# Patient Record
Sex: Female | Born: 1950 | Race: White | Hispanic: No | Marital: Married | Smoking: Former smoker
Health system: Southern US, Community
[De-identification: ages and names within clinical notes are randomized; demographics above are authoritative.]

## PROBLEM LIST (undated history)

## (undated) DIAGNOSIS — K219 Gastro-esophageal reflux disease without esophagitis: Secondary | ICD-10-CM

## (undated) DIAGNOSIS — I2510 Atherosclerotic heart disease of native coronary artery without angina pectoris: Secondary | ICD-10-CM

## (undated) DIAGNOSIS — I10 Essential (primary) hypertension: Secondary | ICD-10-CM

## (undated) DIAGNOSIS — Z951 Presence of aortocoronary bypass graft: Secondary | ICD-10-CM

## (undated) DIAGNOSIS — J449 Chronic obstructive pulmonary disease, unspecified: Secondary | ICD-10-CM

## (undated) HISTORY — PX: ABDOMINAL HYSTERECTOMY: SHX81

---

## 2005-08-31 ENCOUNTER — Emergency Department: Payer: Self-pay | Admitting: Internal Medicine

## 2008-08-15 ENCOUNTER — Inpatient Hospital Stay: Payer: Self-pay | Admitting: Internal Medicine

## 2008-09-19 ENCOUNTER — Ambulatory Visit: Payer: Self-pay | Admitting: Gastroenterology

## 2009-10-24 ENCOUNTER — Ambulatory Visit: Payer: Self-pay | Admitting: Unknown Physician Specialty

## 2009-10-26 ENCOUNTER — Ambulatory Visit: Payer: Self-pay | Admitting: Unknown Physician Specialty

## 2009-12-04 ENCOUNTER — Ambulatory Visit: Payer: Self-pay | Admitting: Unknown Physician Specialty

## 2010-08-13 ENCOUNTER — Ambulatory Visit: Payer: Self-pay | Admitting: Orthopedic Surgery

## 2010-10-02 ENCOUNTER — Ambulatory Visit: Payer: Self-pay | Admitting: Orthopedic Surgery

## 2010-10-09 ENCOUNTER — Ambulatory Visit: Payer: Self-pay | Admitting: Orthopedic Surgery

## 2011-02-23 ENCOUNTER — Encounter: Payer: Self-pay | Admitting: *Deleted

## 2011-02-23 ENCOUNTER — Emergency Department (HOSPITAL_COMMUNITY): Payer: 59

## 2011-02-23 ENCOUNTER — Emergency Department (HOSPITAL_COMMUNITY)
Admission: EM | Admit: 2011-02-23 | Discharge: 2011-02-23 | Disposition: A | Discharge status: Home / Self Care | Payer: 59 | Attending: Emergency Medicine | Admitting: Emergency Medicine

## 2011-02-23 DIAGNOSIS — N201 Calculus of ureter: Secondary | ICD-10-CM | POA: Insufficient documentation

## 2011-02-23 DIAGNOSIS — R110 Nausea: Secondary | ICD-10-CM | POA: Insufficient documentation

## 2011-02-23 DIAGNOSIS — I10 Essential (primary) hypertension: Secondary | ICD-10-CM | POA: Insufficient documentation

## 2011-02-23 DIAGNOSIS — E119 Type 2 diabetes mellitus without complications: Secondary | ICD-10-CM | POA: Insufficient documentation

## 2011-02-23 DIAGNOSIS — R109 Unspecified abdominal pain: Secondary | ICD-10-CM | POA: Insufficient documentation

## 2011-02-23 DIAGNOSIS — N200 Calculus of kidney: Secondary | ICD-10-CM

## 2011-02-23 DIAGNOSIS — Z79899 Other long term (current) drug therapy: Secondary | ICD-10-CM | POA: Insufficient documentation

## 2011-02-23 DIAGNOSIS — N1330 Unspecified hydronephrosis: Secondary | ICD-10-CM | POA: Insufficient documentation

## 2011-02-23 HISTORY — DX: Essential (primary) hypertension: I10

## 2011-02-23 LAB — COMPREHENSIVE METABOLIC PANEL
ALT: 17 U/L (ref 0–35)
Alkaline Phosphatase: 79 U/L (ref 39–117)
BUN: 15 mg/dL (ref 6–23)
CO2: 25 mEq/L (ref 19–32)
GFR calc Af Amer: >90 mL/min (ref >90)
GFR calc non Af Amer: >90 mL/min (ref >90)
Glucose, Bld: 212 mg/dL — ABNORMAL HIGH (ref 70–99)
Potassium: 3.8 mEq/L (ref 3.5–5.1)
Sodium: 136 mEq/L (ref 135–145)
Total Bilirubin: 0.6 mg/dL (ref 0.3–1.2)
Total Protein: 6.4 g/dL (ref 6.0–8.3)

## 2011-02-23 LAB — URINE MICROSCOPIC-ADD ON

## 2011-02-23 LAB — CBC
HCT: 33.7 % — ABNORMAL LOW (ref 36.0–46.0)
Hemoglobin: 11 g/dL — ABNORMAL LOW (ref 12.0–15.0)
MCH: 25.6 pg — ABNORMAL LOW (ref 26.0–34.0)
MCHC: 32.6 g/dL (ref 30.0–36.0)
RBC: 4.29 MIL/uL (ref 3.87–5.11)

## 2011-02-23 LAB — URINALYSIS, ROUTINE W REFLEX MICROSCOPIC
Bilirubin Urine: NEGATIVE
Glucose, UA: NEGATIVE mg/dL
Specific Gravity, Urine: 1.023 (ref 1.005–1.030)
Urobilinogen, UA: 1 mg/dL (ref 0.0–1.0)

## 2011-02-23 LAB — DIFFERENTIAL
Basophils Relative: 1 % (ref 0–1)
Eosinophils Absolute: 0.3 10*3/uL (ref 0.0–0.7)
Lymphs Abs: 2.1 10*3/uL (ref 0.7–4.0)
Monocytes Absolute: 0.6 10*3/uL (ref 0.1–1.0)
Monocytes Relative: 8 % (ref 3–12)
Neutro Abs: 4.9 10*3/uL (ref 1.7–7.7)

## 2011-02-23 LAB — LIPASE, BLOOD: Lipase: 60 U/L — ABNORMAL HIGH (ref 11–59)

## 2011-02-23 MED ORDER — ONDANSETRON 4 MG PO TBDP: 8.0000 mg | ORAL_TABLET | Freq: Three times a day (TID) | ORAL | Status: AC | PRN

## 2011-02-23 MED ORDER — TAMSULOSIN HCL 0.4 MG PO CAPS: 0.4000 mg | ORAL_CAPSULE | Freq: Two times a day (BID) | ORAL | Status: DC

## 2011-02-23 MED ORDER — TRAMADOL HCL 50 MG PO TABS: 50.0000 mg | ORAL_TABLET | Freq: Four times a day (QID) | ORAL | Status: AC | PRN

## 2011-02-23 MED ORDER — KETOROLAC TROMETHAMINE 30 MG/ML IJ SOLN
30.0000 mg | Freq: Once | INTRAMUSCULAR | Status: AC
  Administered 2011-02-23: 30 mg via INTRAVENOUS
  Filled 2011-02-23: qty 1

## 2011-02-23 MED ORDER — FENTANYL CITRATE 0.05 MG/ML IJ SOLN
100.0000 ug | Freq: Once | INTRAMUSCULAR | Status: AC
  Administered 2011-02-23: 100 ug via INTRAVENOUS
  Filled 2011-02-23: qty 2

## 2011-02-23 MED ORDER — ONDANSETRON HCL 4 MG/2ML IJ SOLN
4.0000 mg | Freq: Once | INTRAMUSCULAR | Status: AC
  Administered 2011-02-23: 4 mg via INTRAVENOUS
  Filled 2011-02-23: qty 2

## 2011-02-23 NOTE — ED Notes (Signed)
PA at bedside.

## 2011-02-23 NOTE — ED Provider Notes (Signed)
History     CSN: [id number]  Arrival date & time 02/23/11  Debbie [name]   First MD Initiated Contact with Patient 02/23/11 1919      Chief Complaint  Patient presents with  . Flank Pain  HPI Patient presents to the emergency room with complaint of 1.5 hours of nausea and right flank pain. Reports that it started suddenly and has been intermittent in nature. Sharp/colicky. No rectal bleeding or diarrhea. No dysuria. No hematuria per patient. Patient has no history of kidney stones.   Past Medical History  Diagnosis Date  . Diabetes mellitus   . Hypertension     History reviewed. No pertinent past surgical history.  History reviewed. No pertinent family history.  History  Substance Use Topics  . Smoking status: Never Smoker   . Smokeless tobacco: Not on file  . Alcohol Use: No    OB History    Grav Para Term Preterm Abortions TAB SAB Ect Mult Living                  Review of Systems  Constitutional: Negative for fever, chills, diaphoresis and appetite change.  HENT: Negative for neck pain.   Eyes: Negative for photophobia and visual disturbance.  Respiratory: Negative for cough, chest tightness and shortness of breath.   Cardiovascular: Negative for chest pain.  Gastrointestinal: Negative for nausea, vomiting, abdominal pain, diarrhea, constipation and rectal pain.  Genitourinary: Positive for flank pain. Negative for vaginal bleeding, vaginal discharge, difficulty urinating, pelvic pain and dyspareunia.  Musculoskeletal: Negative for back pain.  Skin: Negative for rash.  Neurological: Negative for weakness and numbness.  All other systems reviewed and are negative.    Allergies  Hydrocodone; Percodan; and Statins  Home Medications   Current Outpatient Rx  Name Route Sig Dispense Refill  . ACETAMINOPHEN-CODEINE #3 300-30 MG PO TABS Oral Take 1 tablet by mouth once as needed. For pain     . CINNAMON PO Oral Take 1 tablet by mouth daily.      . OMEGA-3 FATTY ACIDS  1000 MG PO CAPS Oral Take 1 g by mouth daily.      [name] LISINOPRIL PO Oral Take 1 tablet by mouth every morning.      [name] METFORMIN HCL 1000 MG PO TABS Oral Take 1,000 mg by mouth 2 (two) times daily with a meal.      . NAPROXEN SODIUM 550 MG PO TABS Oral Take 550 mg by mouth 2 (two) times daily with a meal.      . OMEPRAZOLE 20 MG PO CPDR Oral Take 20 mg by mouth daily.        BP 185/86  Pulse 80  Temp 97.7 F (36.5 C)  Resp 22  SpO2 100%  Physical Exam  Nursing note and vitals reviewed. Constitutional: She is oriented to person, place, and time. She appears well-developed and well-nourished.  Non-toxic appearance. No distress.       Vital signs stable  HENT:  Head: Normocephalic and atraumatic.  Eyes: EOM are normal. Pupils are equal, round, and reactive to light.  Neck: Normal range of motion. Neck supple.  Cardiovascular: Normal rate, regular rhythm, normal heart sounds and intact distal pulses.  Exam reveals no gallop and no friction rub.   No murmur heard. Pulmonary/Chest: Effort normal and breath sounds normal. No respiratory distress. She has no wheezes.  Abdominal: Soft. Bowel sounds are normal. There is no tenderness. There is no rebound and no guarding.  Musculoskeletal: Normal range of  motion. She exhibits no edema and no tenderness.  Neurological: She is alert and oriented to person, place, and time. She exhibits normal muscle tone.  Skin: Skin is warm and dry. No rash noted. She is not diaphoretic.  Psychiatric: She has a normal mood and affect. Her behavior is normal. Judgment and thought content normal.    ED Course  Procedures (including critical care time)  Patient seen and evaluated.  VSS reviewed. . Nursing notes reviewed. Discussed with attending physician. Initial testing ordered. Will monitor the patient closely. They agree with the treatment plan and diagnosis.   Results for orders placed during the hospital encounter of 02/23/11  CBC      Component Value  Range   WBC 8.0  4.0 - 10.5 (K/uL)   RBC 4.29  3.87 - 5.11 (MIL/uL)   Hemoglobin 11.0 (*) 12.0 - 15.0 (g/dL)   HCT 16.1 (*) 09.6 - 46.0 (%)   MCV 78.6  78.0 - 100.0 (fL)   MCH 25.6 (*) 26.0 - 34.0 (pg)   MCHC 32.6  30.0 - 36.0 (g/dL)   RDW 04.5  40.9 - 81.1 (%)   Platelets 257  150 - 400 (K/uL)  DIFFERENTIAL      Component Value Range   Neutrophils Relative 61  43 - 77 (%)   Neutro Abs 4.9  1.7 - 7.7 (K/uL)   Lymphocytes Relative 27  12 - 46 (%)   Lymphs Abs 2.1  0.7 - 4.0 (K/uL)   Monocytes Relative 8  3 - 12 (%)   Monocytes Absolute 0.6  0.1 - 1.0 (K/uL)   Eosinophils Relative 4  0 - 5 (%)   Eosinophils Absolute 0.3  0.0 - 0.7 (K/uL)   Basophils Relative 1  0 - 1 (%)   Basophils Absolute 0.0  0.0 - 0.1 (K/uL)  URINALYSIS, ROUTINE W REFLEX MICROSCOPIC      Component Value Range   Color, Urine YELLOW  YELLOW    APPearance CLOUDY (*) CLEAR    Specific Gravity, Urine 1.023  1.005 - 1.030    pH 6.0  5.0 - 8.0    Glucose, UA NEGATIVE  NEGATIVE (mg/dL)   Hgb urine dipstick MODERATE (*) NEGATIVE    Bilirubin Urine NEGATIVE  NEGATIVE    Ketones, ur 15 (*) NEGATIVE (mg/dL)   Protein, ur NEGATIVE  NEGATIVE (mg/dL)   Urobilinogen, UA 1.0  0.0 - 1.0 (mg/dL)   Nitrite NEGATIVE  NEGATIVE    Leukocytes, UA MODERATE (*) NEGATIVE   COMPREHENSIVE METABOLIC PANEL      Component Value Range   Sodium 136  135 - 145 (mEq/L)   Potassium 3.8  3.5 - 5.1 (mEq/L)   Chloride 102  96 - 112 (mEq/L)   CO2 25  19 - 32 (mEq/L)   Glucose, Bld 212 (*) 70 - 99 (mg/dL)   BUN 15  6 - 23 (mg/dL)   Creatinine, Ser 9.14  0.50 - 1.10 (mg/dL)   Calcium 9.4  8.4 - 78.2 (mg/dL)   Total Protein 6.4  6.0 - 8.3 (g/dL)   Albumin 3.5  3.5 - 5.2 (g/dL)   AST 17  0 - 37 (U/L)   ALT 17  0 - 35 (U/L)   Alkaline Phosphatase 79  39 - 117 (U/L)   Total Bilirubin 0.6  0.3 - 1.2 (mg/dL)   GFR calc non Af Amer >90  >90 (mL/min)   GFR calc Af Amer >90  >90 (mL/min)  LIPASE, BLOOD  Component Value Range   Lipase 60  (*) 11 - 59 (U/L)  URINE MICROSCOPIC-ADD ON      Component Value Range   Squamous Epithelial / LPF FEW (*) RARE    WBC, UA 11-20  <3 (WBC/hpf)   RBC / HPF 11-20  <3 (RBC/hpf)   Bacteria, UA RARE  RARE    Urine-Other MUCOUS PRESENT     Ct Abdomen Pelvis Wo Contrast  02/23/2011  *RADIOLOGY REPORT*  Clinical Data: Right flank pain.  CT ABDOMEN AND PELVIS WITHOUT CONTRAST  Technique:  Multidetector CT imaging of the abdomen and pelvis was performed following the standard protocol without intravenous contrast.  Comparison: None  Findings: The lung bases are clear.  No pleural effusion. A small hiatal hernia is noted.  The unenhanced appearance of the liver is unremarkable.  No focal lesions or intrahepatic biliary dilatation.  The gallbladder demonstrates numerous small calcified gallstones.  No common bile duct dilatation.  The pancreas is unremarkable.  The spleen is normal in size.  No focal lesions.  The adrenal glands and left kidney are normal.  The right kidney demonstrates marked hydronephrosis along with perinephric interstitial changes and fluid consistent with a high-grade obstruction.  A small upper pole renal calculus is noted.  There is right-sided hydroureter down to an obstructing 2 mm calculus located at the pelvic inlet.  No bladder calculi.  No left-sided ureteral calculi.  The stomach, duodenum, small bowel and colon are grossly normal without oral contrast.  The appendix is normal.  No mesenteric or retroperitoneal masses or lymphadenopathy.  The aorta demonstrates advanced atherosclerotic calcifications but no focal aneurysm.  The uterus is surgically absent.  The ovaries are still present and appear normal.  The bladder is normal.  No pelvic mass, adenopathy or free pelvic fluid collections.  No inguinal mass or hernia.  The bony structures are unremarkable.  IMPRESSION:  1.  2 mm obstructing right ureteral calculus at the pelvic inlet with high grade obstruction. 2.  Small upper pole  right renal calculus. 3.  No other significant acute abdominal/pelvic findings. 4.  Cholelithiasis. 5.  Atherosclerotic calcifications involving the aorta.  Original Report Authenticated By: P. Loralie Champagne, M.D.    Patient seen and re-evaluated. Resting comfortably. VSS stable. NAD. Patient notified of testing results. Stated agreement and understanding. Patient stated understanding to treatment plan and diagnosis.   9:35 PM spoke to dr. Mena Goes who recommended that the patient go home, and follow up with him in the office. Was notified of patients condition and testing results. Denies that any further management is needed at this time.  MDM  Right kidney stone. Stressed importance of following up with urology because of the high grade obstruction. Stated agreement and understanding. Advised of warning signs to return.         Demetrius Charity, [place] 02/23/11 2136

## 2011-02-23 NOTE — ED Notes (Signed)
Patient transported to CT 

## 2011-02-23 NOTE — ED Notes (Signed)
The pt has rt flank and abd pain for 3 hours with nausea.

## 2011-02-23 NOTE — ED Notes (Signed)
Patient currently sitting up in bed; no respiratory or acute distress noted.  Patient updated on plan of care; informed patient that we are currently waiting on PA to come and talk to patient about test results.  Patient has no other questions or concerns at this time; states that he pain medication has helped with her pain.  Family present at bedside.  Will continue to monitor.

## 2011-02-23 NOTE — ED Notes (Signed)
Received bedside report from Jonny Ruiz, [place].  Patient currently sitting up in bed; no respiratory or acute distress noted.  Patient updated on plan of care; informed patient that we need a urine sample.  Patient states that she is unable to go at this time.  Family member at bedside.  Introduced self to patient and updated whiteboard in room.  Will continue to monitor.

## 2011-02-23 NOTE — ED Notes (Signed)
Lab present at bedside.

## 2011-02-23 NOTE — ED Notes (Signed)
Patient back from CT; currently sitting up in bed.  No respiratory or acute distress noted.  Patient updated on plan of care; informed patient that we are currently waiting on CT results.  Patient has no other questions or concerns at this time.  Informed patient again that we need a urine sample.  Will continue to monitor.

## 2011-02-23 NOTE — Discharge Instructions (Signed)
Strain all urine. Follow up with [facility] next week. Return to the ER if you develop the following:   SEEK IMMEDIATE MEDICAL CARE IF:   Pain cannot be controlled with the prescribed medicine.   You have a fever.   The severity or intensity of pain increases over 18 hours and is not relieved by pain medicine.   You develop a new onset of abdominal pain.   You feel faint or pass out.   Kidney Stones Kidney stones (ureteral lithiasis) are deposits that form inside your kidneys. The intense pain is caused by the stone moving through the urinary tract. When the stone moves, the ureter goes into spasm around the stone. The stone is usually passed in the urine.  CAUSES   A disorder that makes certain neck glands produce too much parathyroid hormone (primary hyperparathyroidism).   A buildup of uric acid crystals.   Narrowing (stricture) of the ureter.   A kidney obstruction present at birth (congenital obstruction).   Previous surgery on the kidney or ureters.   Numerous kidney infections.  SYMPTOMS   Feeling sick to your stomach (nauseous).   Throwing up (vomiting).   Blood in the urine (hematuria).   Pain that usually spreads (radiates) to the groin.   Frequency or urgency of urination.  DIAGNOSIS   Taking a history and physical exam.   Blood or urine tests.   Computerized X-ray scan (CT scan).   Occasionally, an examination of the inside of the urinary bladder (cystoscopy) is performed.  TREATMENT   Observation.   Increasing your fluid intake.   Surgery may be needed if you have severe pain or persistent obstruction.  The size, location, and chemical composition are all important variables that will determine the proper choice of action for you. Talk to your caregiver to better understand your situation so that you will minimize the risk of injury to yourself and your kidney.  HOME CARE INSTRUCTIONS   Drink enough water and fluids to keep your urine  clear or pale yellow.   Strain all urine through the provided strainer. Keep all particulate matter and stones for your caregiver to see. The stone causing the pain may be as small as a grain of salt. It is very important to use the strainer each and every time you pass your urine. The collection of your stone will allow your caregiver to analyze it and verify that a stone has actually passed.   Only take over-the-counter or prescription medicines for pain, discomfort, or fever as directed by your caregiver.   Make a follow-up appointment with your caregiver as directed.   Get follow-up X-rays if required. The absence of pain does not always mean that the stone has passed. It may have only stopped moving. If the urine remains completely obstructed, it can cause loss of kidney function or even complete destruction of the kidney. It is your responsibility to make sure X-rays and follow-ups are completed. Ultrasounds of the kidney can show blockages and the status of the kidney. Ultrasounds are not associated with any radiation and can be performed easily in a matter of minutes.  MAKE SURE YOU:   Understand these instructions.   Will watch your condition.   Will get help right away if you are not doing well or get worse.  Document Released: 02/03/2005 Document Revised: 10/16/2010 Document Reviewed: 06/01/2009 [facility] Patient Information 2012 [place], [place].

## 2011-02-23 NOTE — ED Notes (Addendum)
Patient given copy of discharge paperwork; went over discharge instructions with patient.  Instructed patient to follow up with Urology within 2 days, to follow up with primary care physician, to take medications as prescribed (Flomax, Ultram, and Zofran), to strain all urine, and to return to the ED for new, worsening, or concerning symptoms.

## 2011-02-25 LAB — URINE CULTURE

## 2011-03-07 NOTE — ED Provider Notes (Signed)
Medical screening examination/treatment/procedure(s) were performed by non-physician practitioner and as supervising physician I was immediately available for consultation/collaboration.    Nelia Shi, MD 03/07/11 [phone]

## 2013-01-09 ENCOUNTER — Observation Stay: Payer: Self-pay | Admitting: Internal Medicine

## 2013-01-09 LAB — BASIC METABOLIC PANEL
Anion Gap: 5 — ABNORMAL LOW (ref 7–16)
BUN: 16 mg/dL (ref 7–18)
Chloride: 104 mmol/L (ref 98–107)
Co2: 27 mmol/L (ref 21–32)
EGFR (African American): >60
Osmolality: 285 (ref 275–301)
Potassium: 3.8 mmol/L (ref 3.5–5.1)
Sodium: 136 mmol/L (ref 136–145)

## 2013-01-09 LAB — HEMOGLOBIN A1C: Hemoglobin A1C: 13.6 % — ABNORMAL HIGH (ref 4.2–6.3)

## 2013-01-09 LAB — URINALYSIS, COMPLETE
Bacteria: NONE SEEN
Leukocyte Esterase: NEGATIVE
Ph: 7 (ref 4.5–8.0)
Specific Gravity: 1.028 (ref 1.003–1.030)
Squamous Epithelial: 2

## 2013-01-09 LAB — CBC WITH DIFFERENTIAL/PLATELET
Eosinophil #: 0.2 10*3/uL (ref 0.0–0.7)
HCT: 40.5 % (ref 35.0–47.0)
Lymphocyte #: 2.3 10*3/uL (ref 1.0–3.6)
MCHC: 34 g/dL (ref 32.0–36.0)
MCV: 78 fL — ABNORMAL LOW (ref 80–100)
Neutrophil #: 4.5 10*3/uL (ref 1.4–6.5)

## 2013-01-09 LAB — MAGNESIUM: Magnesium: 1.5 mg/dL — ABNORMAL LOW

## 2013-01-09 IMAGING — CR DG CHEST 2V
1 series · 2 of 2 positions shown · non-contrast
Comparison: No priors.

CLINICAL DATA: Dizziness and wheezing.

EXAM:
CHEST  2 VIEW

[Series 1: pa · 0.17mm/px · 2 of 2 slices shown]
[im 1/2]
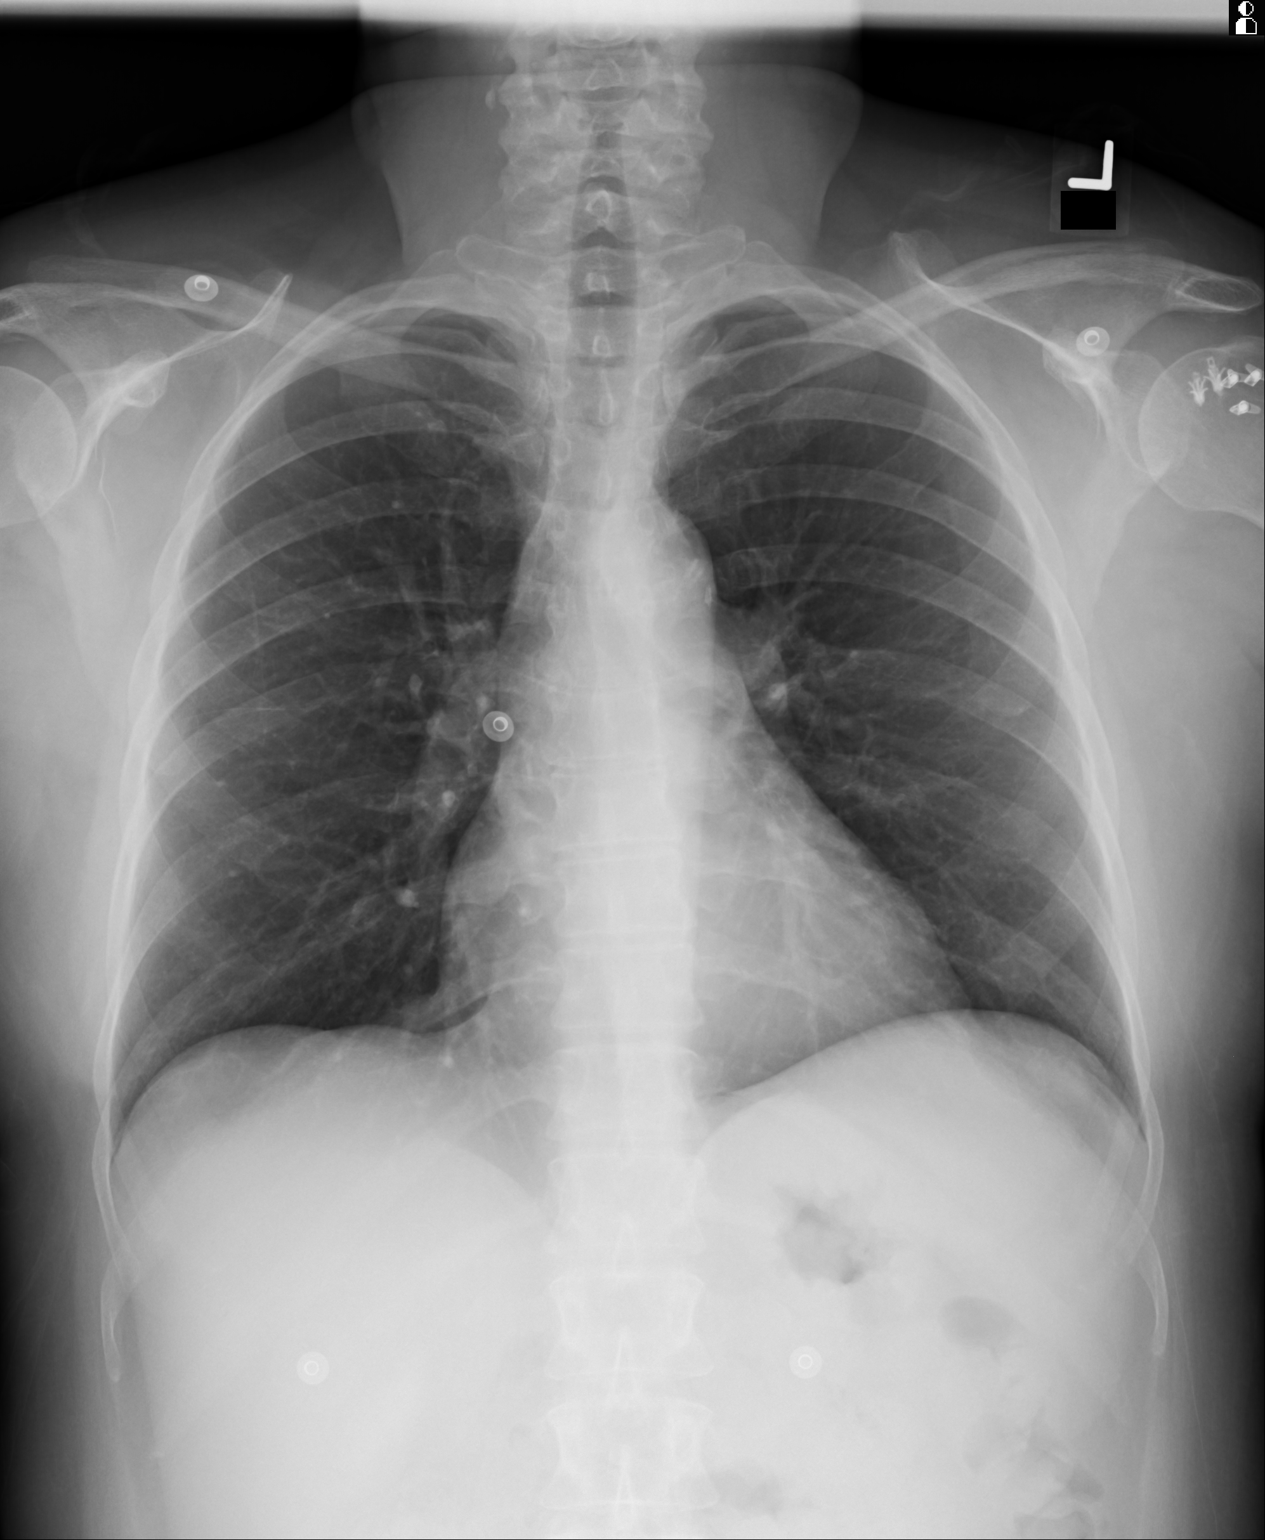
[im 2/2]
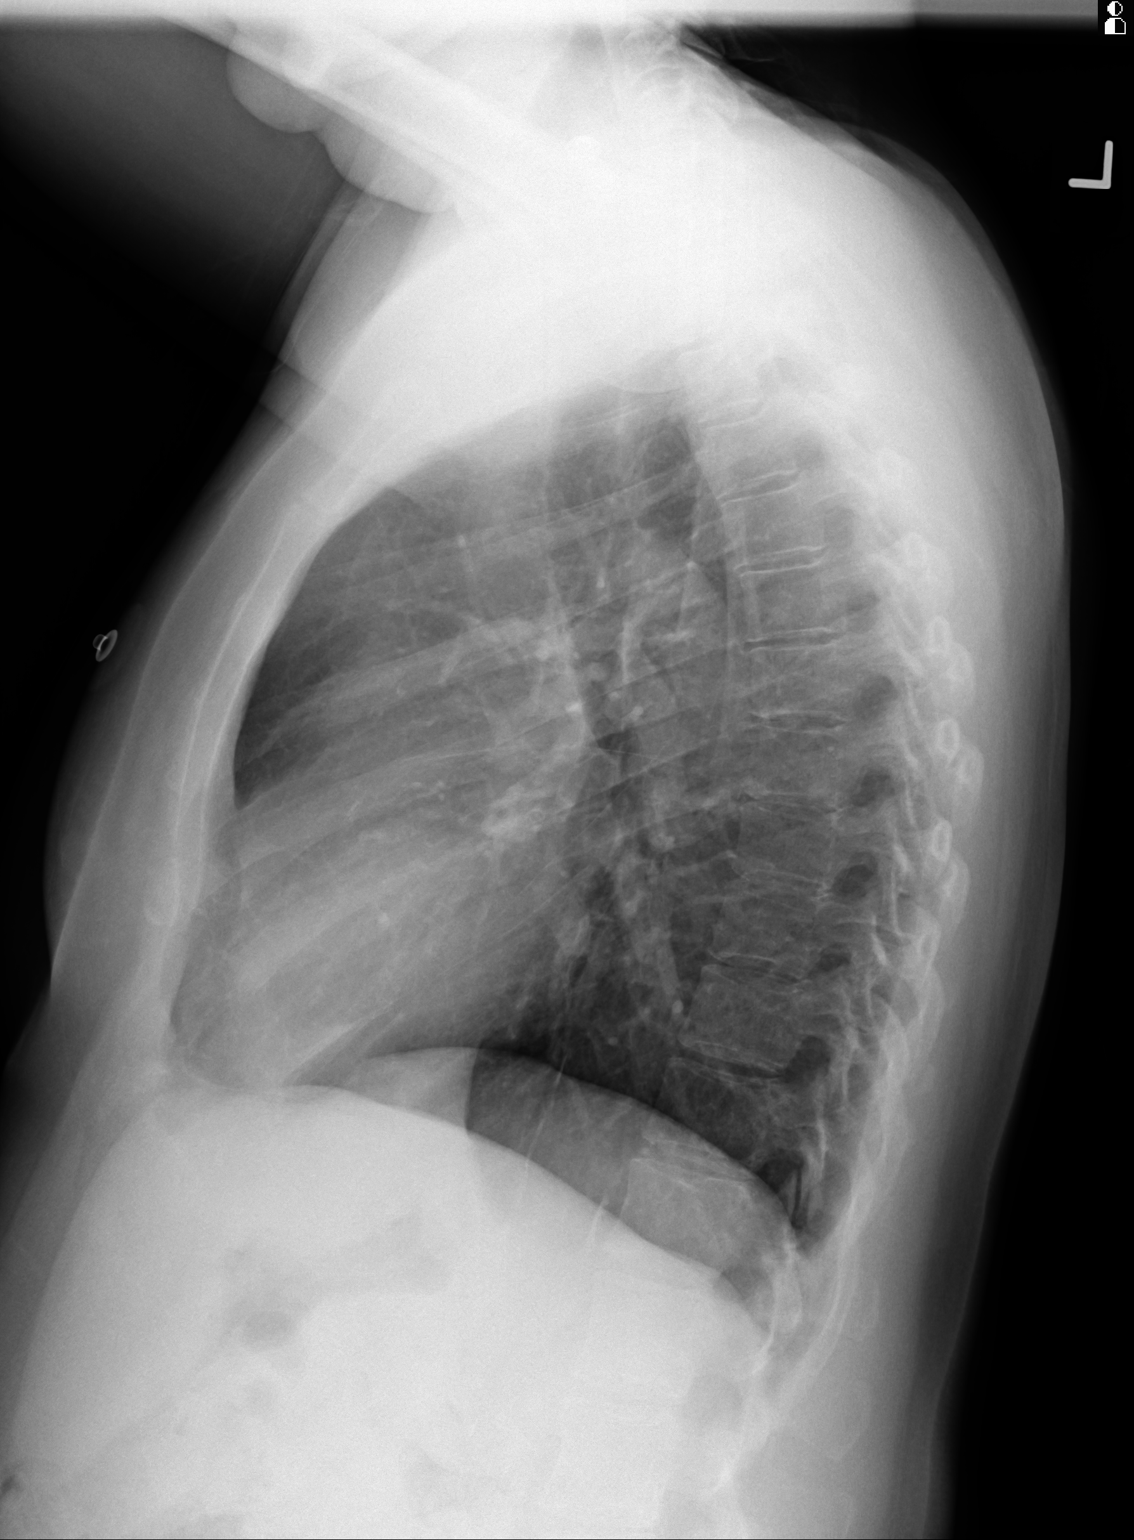

[2 of 2 positions shown; findings below may reference images not displayed]

FINDINGS: Lung volumes are normal. No consolidative airspace disease. No
pleural effusions. No pneumothorax. No pulmonary nodule or mass
noted. Pulmonary vasculature and the cardiomediastinal silhouette
are within normal limits. Atherosclerosis in the thoracic aorta.
IMPRESSION: 1. No radiographic evidence acute cardiopulmonary disease.
2. Atherosclerosis.

## 2013-01-10 LAB — LIPID PANEL
Triglycerides: 166 mg/dL (ref 0–200)
VLDL Cholesterol, Calc: 33 mg/dL (ref 5–40)

## 2013-10-28 DIAGNOSIS — Z951 Presence of aortocoronary bypass graft: Secondary | ICD-10-CM

## 2013-10-28 HISTORY — PX: CORONARY ARTERY BYPASS GRAFT: SHX141

## 2013-10-28 HISTORY — DX: Presence of aortocoronary bypass graft: Z95.1

## 2014-06-09 NOTE — H&P (Signed)
PATIENT NAME:  Debbie [name], Debbie [name] MR#:  [id number] DATE OF BIRTH:  [birth year]  DATE OF ADMISSION:  01/09/2013  PRIMARY CARE PHYSICIAN: Does not have one.   CHIEF COMPLAINT: Dizziness.   HISTORY OF PRESENT ILLNESS: This is a 64 year old female, who presents to the hospital with dizziness, ongoing since yesterday evening around 10:00 p.m. The patient says the dizziness comes and goes. It is associated with some nausea, but no vomiting. She has had no true syncope and no loss of consciousness. The patient has a history of diabetes and hypertension, but has not seen a doctor in 2 years and has not taken any medications in 2 years. She presents to the ER and was noted to have a mildly elevated troponin. She was also noted to be in malignant hypertension. Hospitalist services were contacted for further treatment and evaluation. The patient does complain of chest pain, but she said she has had chest pain on and off for many years. It is more on the left side of her chest, but not radiating to her jaw or the center of her chest. It is not a heaviness. It is a sharp pain. It is not associated with shortness of breath,  nausea, vomiting, or diaphoresis. The patient denies any abdominal pain, any diarrhea, any dysuria, hematuria, or any other associated symptoms presently.   REVIEW OF SYSTEMS: CONSTITUTIONAL: No documented fever. No weight gain or weight loss. EYES: No blurred or double vision. ENT: No tinnitus. No postnasal drip. No redness of the oropharynx. RESPIRATORY: No cough. No wheeze. No hemoptysis. No dyspnea. CARDIOVASCULAR: No chest pain. No orthopnea, no palpitations, no syncope. GASTROINTESTINAL: Positive nausea. No vomiting, no diarrhea. No abdominal pain. No melena or hematochezia. GENITOURINARY: No dysuria, no hematuria. ENDOCRINE: No polyuria or nocturia. No heat or cold intolerance. HEMATOLOGIC: No anemia, no bruising, no bleeding. INTEGUMENTARY: No rashes. No lesions. MUSCULOSKELETAL: No arthritis,  swelling or gout. NEUROLOGIC: No numbness or tingling. No ataxia. Positive dizziness.  PSYCHIATRIC: No anxiety, no insomnia, ADD.   PAST MEDICAL HISTORY: Consistent with diabetes and hypertension.   ALLERGIES: ACTOS, LIPITOR, PERCODAN, PRILOSEC, SPORANOX, STATINS AND ZANTAC. STATINS CAUSE MUSCLE ACHES.   SOCIAL HISTORY: Used to be a smoker, quit about 20 years ago. Does have a 20 pack-year smoking history. Occasional alcohol use. No illicit drug abuse. Lives at home with her husband.   FAMILY HISTORY: Both mother and father are deceased. Father died from complications of pancreatitis. Mother had congestive heart failure, COPD and diabetes.   CURRENT MEDICATIONS: She is currently on no medications.   PHYSICAL EXAMINATION: Presently is as follows:  VITALS SIGNS: Temperature is 97.8, pulse 65, respirations 18, blood pressure 163/83 and sats 98% on room air.  GENERAL: She is a pleasant-appearing female in no apparent distress.  HEENT: Atraumatic, normocephalic. Her extraocular muscles are intact. Pupils equal, reactive to light. Sclerae anicteric. No conjunctival injection. No pharyngeal erythema.  NECK: Supple. There is no jugular venous distention, no bruits, no lymphadenopathy, no thyromegaly.  HEART: Regular rate and rhythm. No murmurs. No rubs. No clicks.  LUNGS: Clear to auscultation bilaterally. No rales or rhonchi. No wheezes.  ABDOMEN: Soft, flat, nontender, nondistended. Has good bowel sounds. No hepatosplenomegaly appreciated.  EXTREMITIES: No evidence of any cyanosis, clubbing, or peripheral edema. Has +2 pedal and radial pulses bilaterally.  NEUROLOGIC: The patient is alert, awake, and oriented x 3 with no focal motor or sensory deficits appreciated bilaterally.  SKIN: Moist and warm with no rashes appreciated.  LYMPHATIC: There is no  cervical or axillary lymphadenopathy.   LABORATORY DATA: Serum glucose of 319, BUN 16, creatinine 0.6, sodium 136, potassium 3.8, chloride 104,  bicarbonate 27. Troponin 0.09. White cell count 7.9, hemoglobin 13.8, hematocrit 40.5, platelet count 210. Urinalysis within normal limits. The patient had a chest x-ray done, which showed no evidence of acute cardiopulmonary disease. The patient also had a CT of the head done, which showed no evidence of any acute intracranial process. The patient also had an EKG, which shows normal sinus rhythm and with normal axis and no evidence of any acute ST or T wave changes.   ASSESSMENT AND PLAN: This is a 64 year old female with past medical history of diabetes and hypertension, who has not seen a physician in 2 years and has not been on any medications for her diabetes or hypertension and presents to the hospital with dizziness and noted to have an elevated troponin.    1.  Dizziness. The exact etiology of this is unclear. Questionable if this is related to underlying malignant hypertension as she is not on any medications versus vertigo or even anginal equivalent given her elevated troponin. For now, I will observe her on telemetry. Follow serial cardiac markers. Her CT head is negative. I do not suspect any other further neurologic source of her dizziness. I will give her p.r.n. meclizine for the vertigo, treat her malignant hypertension and follow her clinically.  2.  Elevated troponin. This is likely in the setting of demand ischemia from her malignant hypertension. I will observe her on telemetry. Continue aspirin, nitroglycerin, add some beta blocker and get a Myoview in the morning. Her EKG does not show any acute ST or T wave changes.  3.  Diabetes. The patient is currently not on any medications. She used to be on metformin and Januvia, but could not tolerate the metformin due to side effects. For now, will place on sliding scale insulin and start her on some oral glipizide. Check a hemoglobin A1c. Follow blood sugars.  4.  Malignant hypertension. Since she has a history of diabetes, I will start her on  some oral lisinopril and also some low-dose beta blocker and also add p.r.n. hydralazine and follow her hemodynamics.   CODE STATUS: The patient is a full code.   TIME SPENT ON ADMISSION: 50 minutes.   ____________________________ Rolly Pancake. Cherlynn Kaiser, MD vjs:aw D: 01/09/2013 09:18:42 ET T: 01/09/2013 09:28:32 ET JOB#: [id number]  cc: Rolly Pancake. Cherlynn Kaiser, MD, <Dictator> Houston Siren MD ELECTRONICALLY SIGNED 01/22/2013 18:33

## 2014-06-09 NOTE — Discharge Summary (Signed)
PATIENT NAME:  Debbie [name], Debbie [name] MR#:  [id number] DATE OF BIRTH:  [birth year]  DATE OF ADMISSION:  01/09/2013 DATE OF DISCHARGE:  01/10/2013  PRESENTING COMPLAINT: Dizziness.   DISCHARGE DIAGNOSES:  1.  Dizziness with transient bradycardia that resolved.  2.  Uncontrolled type 2 diabetes.  3.  Malignant hypertension.  4.  History of migraine headaches.  5.  Medical noncompliance.   Myoview stress test within normal limits. Vital signs stable on discharge, heart rate was 65 to 72, sinus rhythm.   CODE STATUS: Full code.   MEDICATIONS:  1.  Lisinopril 10 mg daily.  2,  Glipizide 10 mg daily.  3.  Aspirin 325 mg p.o. daily.   DIET: Low salt, low fat, low cholesterol, ADA, 1800 calorie diet.   The patient advised to check sugars at home and monitor blood pressure.   FOLLOWUP: With [facility].   CARDIOLOGY CONSULTATION: Dr. Juliann Paresallwood.   Myoview stress test was reported as within normal limits, EF of 61%. Cardiac enzymes x 3 negative. Cholesterol is 287,  HDL is 36 and LDL is 216. Echo Doppler showed EF of 55% to 60%, normal left ventricular systolic function. Troponin was 0.08 and 0.08. UA negative for UTI. CBC within normal limits. Basic metabolic panel within normal limits except glucose of 319. B-type natriuretic peptide is 67. Hemoglobin A1c is 13.6. EKG showed sinus rhythm with fusion complexes.   BRIEF SUMMARY OF HOSPITAL COURSE:  1.  [name] is a 64 year old Caucasian female with uncontrolled hypertension and diabetes, who has medical noncompliance, who comes to the Emergency Room after she had dizziness and noted to have elevated troponin. She was admitted with dizziness. Exact etiology was unclear; however, it could be related to a malignant hypertension and she is not on any meds at home. Her head CT was negative. The patient's symptoms resolved. She did not have any further neurologic source of her dizziness that was identified. 2.  Elevated troponin, likely in the  setting of demand ischemia from malignant hypertension. She was continued on aspirin and nitroglycerin as needed. Myoview was done, which was within normal limits. Beta blockers were discontinued because of her heart rate dropping transiently into the 40s. She was tolerating lisinopril well.  3.  Type 2 diabetes, currently not on medications at home. She used to on metformin and Januvia but could not tolerate it. For now, we started her on glipizide. She is recommended followup dietary recommendations. Her hemoglobin A1c is 13.6.  4.  Malignant hypertension. The patient was placed on lisinopril.  5.  History of migraine headaches. P.r.n. Excedrin as an outpatient. 6.  Hyperlipidemia. The patient cannot tolerate any statins and Zetia is too expensive for her to get hence dietary changes and restrictions were explained to her. Hospital stay otherwise remained stable.   CODE STATUS: The patient remained a full code.   TIME SPENT: 40 minutes. ____________________________ Wylie HailSona A. Allena KatzPatel, MD sap:aw D: 01/12/2013 10:44:07 ET T: 01/12/2013 11:55:56 ET JOB#: [id number]  cc: [name] A. Allena KatzPatel, MD, <Dictator> [facility] Willow OraSONA A Destiney Sanabia MD ELECTRONICALLY SIGNED 01/14/2013 10:41

## 2014-06-10 NOTE — Consult Note (Signed)
PATIENT NAME:  Debbie [name], Latara F MR#:  [id number] DATE OF BIRTH:  [birth year]  DATE OF CONSULTATION:  01/09/2013  REFERRING PHYSICIAN:  Dr. Cherlynn KaiserSainani CONSULTING PHYSICIAN:  Tsutomu Barfoot D. Mikayla Chiusano, MD  HISTORY OF PRESENT ILLNESS: The patient is a 64 year old female with history of hypertension, diabetes, hyperlipidemia, has not been taking any of her medications recently. She states she lost her insurance and stopped taking her medicines, but at this time she started having dizziness on and off since the day before admission. There was no true loss of consciousness, just felt dizzy and near syncopal. Finally came to the Emergency Room and was found elevated troponins and malignant hypertension, with extremely elevated blood pressures. She had had some left-sided chest discomfort, nonradiating, denies shortness of breath. No nausea, vomiting, diarrhea. Felt poorly, so finally was admitted for evaluation and also had some mild bradycardia.  REVIEW OF SYSTEMS: No blackout spells or syncope, just near syncope and vertigo. No weight loss or weight gain, no hemoptysis or hematemesis. No vision change or hearing change. Denies sputum production. Denies cough.   PAST MEDICAL HISTORY: Diabetes, hypertension, hyperlipidemia.  FAMILY HISTORY: Pancreatitis, congestive heart failure, chronic obstructive pulmonary disease, and diabetes.   SOCIAL HISTORY: Former smoker, quit 20 years ago. Married, lives with her family, is working now.   MEDICATIONS: None.   ALLERGIES: ACTOS, LIPITOR, PERCODAN, PRILOSEC, SPIROMAX, STATIN, ZANTAC.   PHYSICAL EXAMINATION: VITAL SIGNS:  Blood pressure 165/80, pulse of 45, respiratory rate 16, afebrile.  HEENT: Normocephalic, atraumatic. Pupils are reactive to light.  NECK: Supple.  LUNGS: Clear.  HEART: Bradycardic. Systolic ejection murmur, left sternal border. PMI nondisplaced.  ABDOMEN: Benign. Nausea, upset stomach, reflux.  SKIN: Normal.   LABORATORY, DIAGNOSTIC, AND  RADIOLOGICAL DATA: Glucose 319, BUN 16, creatinine 0.6, sodium 136, potassium 3.8, chloride 104, bicarbonate 27, troponin 0.09. White count of 7.9, hemoglobin 13.8, hematocrit 40.5, platelet count 310. Urinalysis normal. Chest x-ray negative. CT of the head negative. The patient also had an EKG, which showed normal sinus rhythm slight bradycardia, nonspecific ST-T changes.   ASSESSMENT: Vertigo, elevated troponin, diabetes, malignant hypertension, past history of smoking.   PLAN: 1.  Agree with evaluation including rule out for myocardial infarction. Follow-up cardiac enzymes. Follow up vertigo. Would consider an EEG, consider carotid Dopplers. Consider echocardiogram. Follow-up EKGs, follow-up troponins. Continue hypertension control, hydralazine or amlodipine would be helpful. We will probably stay away from beta blockers, because of her bradycardia. Also consider ACE inhibitors.  2.  For diabetes, would resume diabetes medications and treat as necessary to follow up evidence of hemoglobin A1c as well as fasting sugars.  3.  Recommend evaluation for hypercholesterolemia.  4.  Chest pain of unclear etiology may be angina. Would recommend either a functional study or cardiac catheterization unless symptoms persist or worsen, and base further evaluation on the patient's response.    ____________________________ Bobbie Stackwayne D. Juliann Paresallwood, MD ddc:cg D: 01/09/2013 18:29:08 ET T: 01/10/2013 00:08:34 ET JOB#: [id number]  cc: Naureen Benton D. Juliann Paresallwood, MD, <Dictator> Alwyn PeaWAYNE D Morgyn Marut MD ELECTRONICALLY SIGNED 02/23/2013 10:22

## 2014-09-09 ENCOUNTER — Inpatient Hospital Stay (HOSPITAL_COMMUNITY)
Admission: EM | Admit: 2014-09-09 | Discharge: 2014-09-14 | DRG: 247 | Disposition: A | Discharge status: Home / Self Care | Payer: 59 | Attending: Internal Medicine | Admitting: Internal Medicine

## 2014-09-09 ENCOUNTER — Encounter (HOSPITAL_COMMUNITY): Payer: Self-pay

## 2014-09-09 ENCOUNTER — Emergency Department (HOSPITAL_COMMUNITY): Payer: 59

## 2014-09-09 DIAGNOSIS — I2582 Chronic total occlusion of coronary artery: Secondary | ICD-10-CM | POA: Diagnosis present

## 2014-09-09 DIAGNOSIS — E669 Obesity, unspecified: Secondary | ICD-10-CM | POA: Diagnosis present

## 2014-09-09 DIAGNOSIS — I2510 Atherosclerotic heart disease of native coronary artery without angina pectoris: Secondary | ICD-10-CM | POA: Diagnosis not present

## 2014-09-09 DIAGNOSIS — I200 Unstable angina: Secondary | ICD-10-CM | POA: Diagnosis present

## 2014-09-09 DIAGNOSIS — E1165 Type 2 diabetes mellitus with hyperglycemia: Secondary | ICD-10-CM | POA: Diagnosis present

## 2014-09-09 DIAGNOSIS — R0789 Other chest pain: Secondary | ICD-10-CM | POA: Diagnosis not present

## 2014-09-09 DIAGNOSIS — Z87891 Personal history of nicotine dependence: Secondary | ICD-10-CM

## 2014-09-09 DIAGNOSIS — E119 Type 2 diabetes mellitus without complications: Secondary | ICD-10-CM

## 2014-09-09 DIAGNOSIS — K219 Gastro-esophageal reflux disease without esophagitis: Secondary | ICD-10-CM | POA: Diagnosis present

## 2014-09-09 DIAGNOSIS — Z951 Presence of aortocoronary bypass graft: Secondary | ICD-10-CM

## 2014-09-09 DIAGNOSIS — I25110 Atherosclerotic heart disease of native coronary artery with unstable angina pectoris: Secondary | ICD-10-CM | POA: Diagnosis present

## 2014-09-09 DIAGNOSIS — R079 Chest pain, unspecified: Secondary | ICD-10-CM

## 2014-09-09 DIAGNOSIS — Z885 Allergy status to narcotic agent status: Secondary | ICD-10-CM

## 2014-09-09 DIAGNOSIS — I25710 Atherosclerosis of autologous vein coronary artery bypass graft(s) with unstable angina pectoris: Principal | ICD-10-CM | POA: Diagnosis present

## 2014-09-09 DIAGNOSIS — I10 Essential (primary) hypertension: Secondary | ICD-10-CM | POA: Diagnosis present

## 2014-09-09 DIAGNOSIS — J449 Chronic obstructive pulmonary disease, unspecified: Secondary | ICD-10-CM | POA: Diagnosis present

## 2014-09-09 DIAGNOSIS — Z888 Allergy status to other drugs, medicaments and biological substances status: Secondary | ICD-10-CM

## 2014-09-09 DIAGNOSIS — Z6838 Body mass index (BMI) 38.0-38.9, adult: Secondary | ICD-10-CM

## 2014-09-09 DIAGNOSIS — Z794 Long term (current) use of insulin: Secondary | ICD-10-CM

## 2014-09-09 DIAGNOSIS — Z7951 Long term (current) use of inhaled steroids: Secondary | ICD-10-CM

## 2014-09-09 DIAGNOSIS — Z9861 Coronary angioplasty status: Secondary | ICD-10-CM

## 2014-09-09 DIAGNOSIS — E785 Hyperlipidemia, unspecified: Secondary | ICD-10-CM | POA: Diagnosis present

## 2014-09-09 DIAGNOSIS — R778 Other specified abnormalities of plasma proteins: Secondary | ICD-10-CM | POA: Diagnosis present

## 2014-09-09 DIAGNOSIS — R7989 Other specified abnormal findings of blood chemistry: Secondary | ICD-10-CM | POA: Diagnosis present

## 2014-09-09 DIAGNOSIS — Z791 Long term (current) use of non-steroidal anti-inflammatories (NSAID): Secondary | ICD-10-CM

## 2014-09-09 HISTORY — DX: Gastro-esophageal reflux disease without esophagitis: K21.9

## 2014-09-09 HISTORY — DX: Atherosclerotic heart disease of native coronary artery without angina pectoris: I25.10

## 2014-09-09 HISTORY — DX: Chronic obstructive pulmonary disease, unspecified: J44.9

## 2014-09-09 HISTORY — DX: Presence of aortocoronary bypass graft: Z95.1

## 2014-09-09 LAB — URINE MICROSCOPIC-ADD ON

## 2014-09-09 LAB — BASIC METABOLIC PANEL
Anion gap: 13 (ref 5–15)
BUN: 15 mg/dL (ref 6–20)
CALCIUM: 9.3 mg/dL (ref 8.9–10.3)
CO2: 20 mmol/L — ABNORMAL LOW (ref 22–32)
Chloride: 102 mmol/L (ref 101–111)
Creatinine, Ser: 0.89 mg/dL (ref 0.44–1.00)
GFR calc Af Amer: >60 mL/min (ref >60)
GLUCOSE: 347 mg/dL — AB (ref 65–99)
Potassium: 4 mmol/L (ref 3.5–5.1)
SODIUM: 135 mmol/L (ref 135–145)

## 2014-09-09 LAB — URINALYSIS, ROUTINE W REFLEX MICROSCOPIC
Bilirubin Urine: NEGATIVE
Glucose, UA: >1000 mg/dL — AB
Hgb urine dipstick: NEGATIVE
Ketones, ur: NEGATIVE mg/dL
NITRITE: NEGATIVE
Protein, ur: NEGATIVE mg/dL
Specific Gravity, Urine: 1.018 (ref 1.005–1.030)
UROBILINOGEN UA: 0.2 mg/dL (ref 0.0–1.0)
pH: 6 (ref 5.0–8.0)

## 2014-09-09 LAB — CBC
HCT: 37.4 % (ref 36.0–46.0)
Hemoglobin: 12.4 g/dL (ref 12.0–15.0)
MCH: 26.2 pg (ref 26.0–34.0)
MCHC: 33.2 g/dL (ref 30.0–36.0)
MCV: 78.9 fL (ref 78.0–100.0)
Platelets: 246 10*3/uL (ref 150–400)
RBC: 4.74 MIL/uL (ref 3.87–5.11)
RDW: 14.8 % (ref 11.5–15.5)
WBC: 9 10*3/uL (ref 4.0–10.5)

## 2014-09-09 LAB — BRAIN NATRIURETIC PEPTIDE: B NATRIURETIC PEPTIDE 5: 24.7 pg/mL (ref 0.0–100.0)

## 2014-09-09 LAB — I-STAT TROPONIN, ED: Troponin i, poc: 0.04 ng/mL (ref 0.00–0.08)

## 2014-09-09 LAB — PROTIME-INR
INR: 0.99 (ref 0.00–1.49)
PROTHROMBIN TIME: 13.3 s (ref 11.6–15.2)

## 2014-09-09 MED ORDER — SODIUM CHLORIDE 0.9 % IV BOLUS (SEPSIS)
1000.0000 mL | Freq: Once | INTRAVENOUS | Status: AC
  Administered 2014-09-09: 1000 mL via INTRAVENOUS

## 2014-09-09 MED ORDER — NITROGLYCERIN 2 % TD OINT
1.0000 [in_us] | TOPICAL_OINTMENT | Freq: Once | TRANSDERMAL | Status: AC
  Administered 2014-09-09: 1 [in_us] via TOPICAL
  Filled 2014-09-09: qty 1

## 2014-09-09 NOTE — ED Notes (Signed)
Per [facility], Pt reports CP episode last night that lasted 2 hours and stopped, then pt began to have CP 2 hours ago that subsided after 1 nitro given by EMS. Pt placed on o2 2L [place]. Pt sats at 92% and some wheezing heard by ems and pt refused breathing treatment. SOB worse with exertion. Pt stable and states no chest pain upon arrival to the ED. Pt stable and in NAD. EMS VS 160/94, HR 85, 12 showed tachycardia initially and unremarkable now. 98% on 2L [place]. Pt had quadruple bypass in September 2015 and this feels like the same.

## 2014-09-09 NOTE — ED Provider Notes (Signed)
CSN: [id number]     Arrival date & time 09/09/14  2145 History   First MD Initiated Contact with Patient 09/09/14 2201     Chief Complaint  Patient presents with  . Chest Pain     (Consider location/radiation/quality/duration/timing/severity/associated sxs/prior Treatment) HPI  64 year old female presents with chest pain that lasts about an hour and a half tonight. The patient had an episode similar to this yesterday that seemed to resolve on its own. Also had one episode last week. Patient noticed pain in her left chest that radiated to her left arm and cause arm numbness while she was sitting on the couch. She got up and went outside and washes walking her symptoms seem to worsen. She had associated shortness of breath as well. Patient states this feels similar to when she required a CABG in September 2015 in [place]. Patient's pain went away after she took a nitroglycerin. She was given aspirin by EMS. She has noticed over last 1 week or so that she's been having bilateral lower extremity edema. Was placed on a fluid pill but states she thinks her legs only improved mildly. Has gained 10 pounds in the past 2 weeks. Patient denies sweating but states it was associated nausea and lightheadedness. Currently feels comfortable.  Past Medical History  Diagnosis Date  . Diabetes mellitus   . Hypertension    History reviewed. No pertinent past surgical history. History reviewed. No pertinent family history. History  Substance Use Topics  . Smoking status: Never Smoker   . Smokeless tobacco: Not on file  . Alcohol Use: No   OB History    No data available     Review of Systems  Constitutional: Negative for diaphoresis.  Respiratory: Positive for shortness of breath.   Cardiovascular: Positive for chest pain and leg swelling.  Gastrointestinal: Positive for nausea. Negative for vomiting and abdominal pain.  Neurological: Positive for light-headedness.  All other systems reviewed and  are negative.     Allergies  Hydrocodone; Percodan; and Statins  Home Medications   Prior to Admission medications   Medication Sig Start Date End Date Taking? Authorizing Provider  acetaminophen-codeine (TYLENOL #3) 300-30 MG per tablet Take 1 tablet by mouth once as needed. For pain     Historical Provider, MD  CINNAMON PO Take 1 tablet by mouth daily.      Historical Provider, MD  fish oil-omega-3 fatty acids 1000 MG capsule Take 1 g by mouth daily.      Historical Provider, MD  LISINOPRIL PO Take 1 tablet by mouth every morning.      Historical Provider, MD  metFORMIN (GLUCOPHAGE) 1000 MG tablet Take 1,000 mg by mouth 2 (two) times daily with a meal.      Historical Provider, MD  naproxen sodium (ANAPROX) 550 MG tablet Take 550 mg by mouth 2 (two) times daily with a meal.      Historical Provider, MD  omeprazole (PRILOSEC) 20 MG capsule Take 20 mg by mouth daily.      Historical Provider, MD  Tamsulosin HCl (FLOMAX) 0.4 MG CAPS Take 1 capsule (0.4 mg total) by mouth 2 (two) times daily. 02/23/11   Rochel Brome, PA-C   There were no vitals taken for this visit. Physical Exam  Constitutional: She is oriented to person, place, and time. She appears well-developed and well-nourished.  HENT:  Head: Normocephalic and atraumatic.  Right Ear: External ear normal.  Left Ear: External ear normal.  Nose: Nose normal.  Eyes: Right  eye exhibits no discharge. Left eye exhibits no discharge.  Cardiovascular: Normal rate, regular rhythm and normal heart sounds.   Pulmonary/Chest: Effort normal and breath sounds normal. She has no wheezes. She has no rales.  Abdominal: Soft. There is no tenderness.  Musculoskeletal: She exhibits edema (mild bilateral lower extremity edema, symmetric).  Neurological: She is alert and oriented to person, place, and time.  Skin: Skin is warm and dry.  Nursing note and vitals reviewed.   ED Course  Procedures (including critical care time) Labs Review Labs  Reviewed  BASIC METABOLIC PANEL - Abnormal; Notable for the following:    CO2 20 (*)    Glucose, Bld 347 (*)    All other components within normal limits  CBC  PROTIME-INR  BRAIN NATRIURETIC PEPTIDE  URINALYSIS, ROUTINE W REFLEX MICROSCOPIC (NOT AT [facility])  Rosezena Sensor, ED    Imaging Review Dg Chest 2 View  09/09/2014   CLINICAL DATA:  Acute onset of left-sided chest pain and shortness of breath. Initial encounter.  EXAM: CHEST  2 VIEW  COMPARISON:  Chest radiograph performed 01/09/2013  FINDINGS: The lungs are well-aerated. Mild bibasilar airspace opacities may reflect atelectasis or mild pneumonia. There is no evidence of pleural effusion or pneumothorax.  The heart is borderline normal in size. The patient is status post median sternotomy. No acute osseous abnormalities are seen. The patient is status post left-sided rotator cuff repair.  IMPRESSION: Mild bibasilar airspace opacities may reflect atelectasis or mild pneumonia.   Electronically Signed   By: Roanna Raider M.D.   On: 09/09/2014 23:04     EKG Interpretation   Date/Time:  Saturday September 09 2014 22:04:34 EDT Ventricular Rate:  82 PR Interval:  167 QRS Duration: 87 QT Interval:  377 QTC Calculation: 440 R Axis:   52 Text Interpretation:  Normal sinus rhythm Anterior infarct, age  indeterminate T wave changes V2-V3 new compared to 2014 Confirmed by  Deaira Leckey  MD, Dawsyn Ramsaran ([id number]) on 09/09/2014 10:13:06 PM      MDM   Final diagnoses:  Acute chest pain    Patient is currently asymptomatic. CP is concerning for cardiac etiology. Given no acute EKG changes, normal troponin and no pain now, do not feel heparin is needed acutely. Will need ACS rule out. Highly doubt PE or dissection. D/w Dr. Mayford Knife, who recommends medicine admission with cards consult if needed. Stable for telemetry admission    Pricilla Loveless, MD 09/09/14 2355

## 2014-09-09 NOTE — ED Notes (Signed)
MD at bedside. 

## 2014-09-09 NOTE — H&P (Signed)
[facility] History and Physical  Debbie FUKUSHIMA ZOX:[id number] DOB: [birth year] DOA: 09/09/2014  Referring physician: ED physician PCP: Pcp Not In System   Chief Complaint: Chest pain  HPI:  Debbie [name] is a 65yo woman with PMH of DM2, HLD, CAD s/p CABG, COPD, HTN, GERD who presents for chest pain.  She reports that last week she developed chest pain when trying to have sex with her husband.  She rested and the symptoms went away.  This happened again last evening and improved with rest.  Today, she developed chest pain with walking to the bedroom.  She took a nitro but the fact that it was so bad scared her and she called EMS.  Debbie [name] has been SOB since her CABG surgery in September of last year, but the shortness of breath has worsened in the last week.  During this last episode, she had radiation of the pain to her left arm/elbow.  She describes the pain as pulling or muscle tightening.  She has further associated symptoms of nausea, LE swelling and weight gain of 10 pounds.  She further has occasional wheezing and heartburn.  She is a former smoker and quit 24 years ago.  She further reports an episode of severe cold intolerance during one of these episodes.  Given her fatigue and this episode, + swelling, will check TSH.   Debbie [name] reports these symptoms are very similar to when she had her CABG last year.  She presented to a hospital in [facility] and had her surgery there.  Records are not available currently.  Debbie [name] is now chest pain free.  She has apparent new TWI and and q waves in anterior leads.  Her initial TnI is 0.04.  She has nitropaste in place.    Assessment and Plan:  Chest pain on exertion - Symptoms and history are concerning for unstable angina - Repeat EKG on arrival to floor for change - AM EKG - Cycle TnI, first is 0.04 - Telemetry - Cardiology should likely be consulted in AM - Attempt to get records from outside hospital - Nitro prn for pain -  Continue ranexa - Patient unable to tolerate beta blocker or statin, these were not started - She is on praluent - Check lipid panel - Check TSH as above  Called by nursing prior to patient being transferred to floor, chest pain returned, nitro given with minimal relief, EKG unchanged.  Plan to cycle enzymes ongoing.  Status changed to cardiac floor for further monitoring.   CAD s/p CABG 10/2013 - Patient with what sounds like stable angina, progressing to unstable angina.  CE and EKG unchanged so far - Attempt to get records from CABG - TTE given swelling, possible post MI CHF - CXR with atelectasis, no pulmonary edema noted  DM2 (diabetes mellitus, type 2) - Last A1C in our system 13.6 from 2014 - Check A1C - Continue cinnamon, lantus.  Started SSI - Patient reports inability to tolerate metformin   COPD (chronic obstructive pulmonary disease) - Mild per patient - Duonebs prn    Benign essential HTN - Continue home medications with norvasc, lisinopril - Patient could not tolerate BB due to side effect of fatigue, but given recurrence of chest pain, it may be important to start this medication    GERD (gastroesophageal reflux disease) - Protonix while in house.    Radiological Exams on Admission: Dg Chest 2 View  09/09/2014   CLINICAL DATA:  Acute onset of left-sided  chest pain and shortness of breath. Initial encounter.  EXAM: CHEST  2 VIEW  COMPARISON:  Chest radiograph performed 01/09/2013  FINDINGS: The lungs are well-aerated. Mild bibasilar airspace opacities may reflect atelectasis or mild pneumonia. There is no evidence of pleural effusion or pneumothorax.  The heart is borderline normal in size. The patient is status post median sternotomy. No acute osseous abnormalities are seen. The patient is status post left-sided rotator cuff repair.  IMPRESSION: Mild bibasilar airspace opacities may reflect atelectasis or mild pneumonia.   Electronically Signed   By: Roanna Raider  M.D.   On: 09/09/2014 23:04   Code Status: Full Family Communication: Pt at bedside Disposition Plan: Admit for further evaluation    Debe Coder, MD [phone]   Review of Systems:  Constitutional: + for malaise, fatigue.  Taken off beta blocker to see if this improves. Negative for fever, chills and diaphoresis.  HENT: Negative for hearing loss, ear pain Eyes: Negative for blurred vision, double vision Respiratory: + for persistent and worsening SOB, wheezing Negative for cough, hemoptysis, sputum production  Cardiovascular: + for chest pain, leg swelling.  Negative for palpitations, orthopnea, claudication.  Gastrointestinal: + for nausea, heartburn Negative for vomiting and abdominal pain,  constipation, blood in stool and melena.  Genitourinary: Negative for dysuria, urgency Musculoskeletal: Negative for myalgias, back pain, joint pain and falls.  Skin: Negative for itching and rash.  Neurological: Negative for dizziness and weakness Psychiatric/Behavioral: The patient is not nervous/anxious.      Past Medical History  Diagnosis Date  . Diabetes mellitus   . Hypertension   . CAD (coronary artery disease)   . S/P CABG (coronary artery bypass graft)   . COPD (chronic obstructive pulmonary disease)   . GERD (gastroesophageal reflux disease)     Past Surgical History  Procedure Laterality Date  . Coronary artery bypass graft      2015    Social History:  reports that she has quit smoking. She does not have any smokeless tobacco history on file. She reports that she does not drink alcohol or use illicit drugs.  Allergies  Allergen Reactions  . Hydrocodone Other (See Comments)    Hallucinations  . Percodan [Oxycodone-Aspirin] Other (See Comments)    hallucination  . Statins Other (See Comments)    Muscles ache    History reviewed. No pertinent family history.  Medications: Reviewed form patient had  Ranexa 500 every 12 hours Norvasc 5 mg daily Lisinopril 20 mg  daily Lantus 25 units at bedtime Praluent /mL every 14 days Cinnamon  daily Aspirin 325 daily Lansoprazole 15 mg daily Albuterol  2 puffs q 6 hours prn wheezing Vitamin D 2000 units daily MVI  Physical Exam: Filed Vitals:   09/09/14 2222 09/09/14 2230 09/09/14 2318 09/09/14 2330  BP: 157/67 150/56 140/56 145/66  Pulse: 88 85 75 72  Temp: 97.9 F (36.6 C)     TempSrc: Oral     Resp: SpO2: 99% 95% 98% 97%    Physical Exam  Constitutional: Appears well-developed and well-nourished. No distress. Tan HENT: Normocephalic. Atraumatic Eyes: Conjunctivae and EOM are normal. no scleral icterus.  Neck: Normal ROM. Neck supple. No JVD.  CVS: RR, NR, S1/S2 +, no murmurs.  Healing midline scare on the sternum.  No reproduction of pain.  Pulmonary: Effort and breath sounds normal, no rhonchi, wheezes, rales.  Abdominal: Soft. BS +,  no distension, tenderness Musculoskeletal: + non pitting edema to bilateral LE.  Pulses  intact.  Neuro: Alert. Oriented X 3 Skin: Skin is warm and dry. No rash noted. Not diaphoretic. No erythema. No pallor.  Psychiatric: Normal mood and affect. Behavior, judgment, thought content normal.   Labs on Admission:  Basic Metabolic Panel:  Recent Labs Lab 09/09/14 2214  NA 135  K 4.0  CL 102  CO2 20*  GLUCOSE 347*  BUN 15  CREATININE 0.89  CALCIUM 9.3   CBC:  Recent Labs Lab 09/09/14 2214  WBC 9.0  HGB 12.4  HCT 37.4  MCV 78.9  PLT 246   TnI 0.04  EKG: Normal sinus rhythm, possible anterior infarct, old with q waves in V1-V2, TWI in V2-V3.  Changed from previous, but we do not have EKGs from the time around her surgery.    If 7PM-7AM, please contact night-coverage [email or web address] Password [email or web address] 09/10/2014, 12:01 AM

## 2014-09-09 NOTE — ED Notes (Signed)
Patient transported to XR. 

## 2014-09-09 NOTE — ED Notes (Signed)
EKG was delayed, due to another pt being in the room at the time of arrival. Pt was placed on an electric bed, which was laying flat at the time and pt was unable to lay flat. Once the bed was moved to the room, the plug was stuck and had to be maneuvered before it could be plugged into the wall. The pt also asked to use the bathroom, once the bed was adjusted.

## 2014-09-10 ENCOUNTER — Encounter (HOSPITAL_COMMUNITY): Payer: Self-pay | Admitting: Internal Medicine

## 2014-09-10 DIAGNOSIS — E1165 Type 2 diabetes mellitus with hyperglycemia: Secondary | ICD-10-CM | POA: Diagnosis not present

## 2014-09-10 DIAGNOSIS — I200 Unstable angina: Secondary | ICD-10-CM | POA: Diagnosis not present

## 2014-09-10 DIAGNOSIS — R079 Chest pain, unspecified: Secondary | ICD-10-CM

## 2014-09-10 DIAGNOSIS — E1159 Type 2 diabetes mellitus with other circulatory complications: Secondary | ICD-10-CM | POA: Diagnosis not present

## 2014-09-10 DIAGNOSIS — I10 Essential (primary) hypertension: Secondary | ICD-10-CM | POA: Diagnosis not present

## 2014-09-10 DIAGNOSIS — R7989 Other specified abnormal findings of blood chemistry: Secondary | ICD-10-CM | POA: Diagnosis not present

## 2014-09-10 DIAGNOSIS — E785 Hyperlipidemia, unspecified: Secondary | ICD-10-CM | POA: Diagnosis not present

## 2014-09-10 DIAGNOSIS — Z951 Presence of aortocoronary bypass graft: Secondary | ICD-10-CM | POA: Diagnosis not present

## 2014-09-10 DIAGNOSIS — I2510 Atherosclerotic heart disease of native coronary artery without angina pectoris: Secondary | ICD-10-CM | POA: Diagnosis not present

## 2014-09-10 DIAGNOSIS — E119 Type 2 diabetes mellitus without complications: Secondary | ICD-10-CM | POA: Diagnosis not present

## 2014-09-10 LAB — GLUCOSE, CAPILLARY
GLUCOSE-CAPILLARY: 241 mg/dL — AB (ref 65–99)
GLUCOSE-CAPILLARY: 158 mg/dL — AB (ref 65–99)
GLUCOSE-CAPILLARY: 135 mg/dL — AB (ref 65–99)
Glucose-Capillary: 260 mg/dL — ABNORMAL HIGH (ref 65–99)
Glucose-Capillary: 246 mg/dL — ABNORMAL HIGH (ref 65–99)

## 2014-09-10 LAB — CBC
HEMATOCRIT: 33.5 % — AB (ref 36.0–46.0)
HEMOGLOBIN: 11.1 g/dL — AB (ref 12.0–15.0)
MCH: 26.3 pg (ref 26.0–34.0)
MCHC: 33.1 g/dL (ref 30.0–36.0)
MCV: 79.4 fL (ref 78.0–100.0)
Platelets: 210 10*3/uL (ref 150–400)
RBC: 4.22 MIL/uL (ref 3.87–5.11)
RDW: 15 % (ref 11.5–15.5)
WBC: 9.3 10*3/uL (ref 4.0–10.5)

## 2014-09-10 LAB — LIPID PANEL
Cholesterol: 216 mg/dL — ABNORMAL HIGH (ref 0–200)
HDL: 45 mg/dL (ref >40)
LDL Cholesterol: 146 mg/dL — ABNORMAL HIGH (ref 0–99)
TRIGLYCERIDES: 124 mg/dL (ref <150)
Total CHOL/HDL Ratio: 4.8 RATIO
VLDL: 25 mg/dL (ref 0–40)

## 2014-09-10 LAB — TROPONIN I
TROPONIN I: 0.17 ng/mL — AB (ref <0.031)
Troponin I: 0.18 ng/mL — ABNORMAL HIGH (ref <0.031)

## 2014-09-10 LAB — CREATININE, SERUM
Creatinine, Ser: 0.78 mg/dL (ref 0.44–1.00)
GFR calc Af Amer: >60 mL/min (ref >60)

## 2014-09-10 LAB — TSH: TSH: 1.55 u[IU]/mL (ref 0.350–4.500)

## 2014-09-10 MED ORDER — ENOXAPARIN SODIUM 40 MG/0.4ML ~~LOC~~ SOLN
40.0000 mg | SUBCUTANEOUS | Status: DC
  Administered 2014-09-10 – 2014-09-11 (×2): 40 mg via SUBCUTANEOUS
  Filled 2014-09-10 (×2): qty 0.4

## 2014-09-10 MED ORDER — VITAMIN D 1000 UNITS PO TABS
2000.0000 [IU] | ORAL_TABLET | Freq: Every day | ORAL | Status: DC
  Administered 2014-09-10 – 2014-09-13 (×4): 2000 [IU] via ORAL
  Filled 2014-09-10 (×5): qty 2

## 2014-09-10 MED ORDER — AMLODIPINE BESYLATE 5 MG PO TABS
5.0000 mg | ORAL_TABLET | Freq: Every day | ORAL | Status: DC
  Administered 2014-09-10: 5 mg via ORAL
  Filled 2014-09-10 (×2): qty 1

## 2014-09-10 MED ORDER — NITROGLYCERIN 0.4 MG SL SUBL
SUBLINGUAL_TABLET | SUBLINGUAL | Status: AC
  Administered 2014-09-10: 0.4 mg
  Filled 2014-09-10: qty 1

## 2014-09-10 MED ORDER — ASPIRIN EC 325 MG PO TBEC
325.0000 mg | DELAYED_RELEASE_TABLET | Freq: Every day | ORAL | Status: DC
  Administered 2014-09-10 – 2014-09-13 (×4): 325 mg via ORAL
  Filled 2014-09-10 (×4): qty 1

## 2014-09-10 MED ORDER — ALIROCUMAB 75 MG/ML ~~LOC~~ SOPN: 1.0000 | PEN_INJECTOR | SUBCUTANEOUS | Status: DC

## 2014-09-10 MED ORDER — ADULT MULTIVITAMIN W/MINERALS CH
2.0000 | ORAL_TABLET | Freq: Every day | ORAL | Status: DC
  Administered 2014-09-10 – 2014-09-13 (×4): 2 via ORAL
  Filled 2014-09-10 (×5): qty 2

## 2014-09-10 MED ORDER — ACETAMINOPHEN 325 MG PO TABS
650.0000 mg | ORAL_TABLET | ORAL | Status: DC | PRN
  Administered 2014-09-12: 650 mg via ORAL
  Filled 2014-09-10: qty 2

## 2014-09-10 MED ORDER — RANOLAZINE ER 500 MG PO TB12
500.0000 mg | ORAL_TABLET | Freq: Two times a day (BID) | ORAL | Status: DC
  Administered 2014-09-10 – 2014-09-13 (×8): 500 mg via ORAL
  Filled 2014-09-10 (×9): qty 1

## 2014-09-10 MED ORDER — INSULIN ASPART 100 UNIT/ML ~~LOC~~ SOLN
0.0000 [IU] | Freq: Every day | SUBCUTANEOUS | Status: DC
  Administered 2014-09-10 (×2): 2 [IU] via SUBCUTANEOUS

## 2014-09-10 MED ORDER — CINNAMON 500 MG PO CAPS: 2000.0000 mg | ORAL_CAPSULE | Freq: Every day | ORAL | Status: DC

## 2014-09-10 MED ORDER — ONDANSETRON HCL 4 MG/2ML IJ SOLN
4.0000 mg | Freq: Four times a day (QID) | INTRAMUSCULAR | Status: DC | PRN
  Administered 2014-09-12: 4 mg via INTRAVENOUS
  Filled 2014-09-10: qty 2

## 2014-09-10 MED ORDER — PANTOPRAZOLE SODIUM 20 MG PO TBEC
20.0000 mg | DELAYED_RELEASE_TABLET | Freq: Every day | ORAL | Status: DC
  Administered 2014-09-10 – 2014-09-14 (×5): 20 mg via ORAL
  Filled 2014-09-10 (×5): qty 1

## 2014-09-10 MED ORDER — ALBUTEROL SULFATE HFA 108 (90 BASE) MCG/ACT IN AERS: 2.0000 | INHALATION_SPRAY | Freq: Four times a day (QID) | RESPIRATORY_TRACT | Status: DC | PRN

## 2014-09-10 MED ORDER — LISINOPRIL 10 MG PO TABS
20.0000 mg | ORAL_TABLET | Freq: Every day | ORAL | Status: DC
  Administered 2014-09-10 – 2014-09-13 (×4): 20 mg via ORAL
  Filled 2014-09-10 (×4): qty 1

## 2014-09-10 MED ORDER — INSULIN GLARGINE 100 UNIT/ML ~~LOC~~ SOLN
15.0000 [IU] | Freq: Every day | SUBCUTANEOUS | Status: DC
  Administered 2014-09-10 – 2014-09-11 (×3): 15 [IU] via SUBCUTANEOUS
  Filled 2014-09-10 (×4): qty 0.15

## 2014-09-10 MED ORDER — INSULIN ASPART 100 UNIT/ML ~~LOC~~ SOLN
0.0000 [IU] | Freq: Three times a day (TID) | SUBCUTANEOUS | Status: DC
  Administered 2014-09-10: 8 [IU] via SUBCUTANEOUS
  Administered 2014-09-10 – 2014-09-11 (×2): 3 [IU] via SUBCUTANEOUS
  Administered 2014-09-11 – 2014-09-12 (×4): 5 [IU] via SUBCUTANEOUS
  Administered 2014-09-13 – 2014-09-14 (×3): 3 [IU] via SUBCUTANEOUS

## 2014-09-10 MED ORDER — MORPHINE SULFATE 2 MG/ML IJ SOLN
2.0000 mg | INTRAMUSCULAR | Status: DC | PRN
  Administered 2014-09-13: 2 mg via INTRAVENOUS
  Filled 2014-09-10: qty 1

## 2014-09-10 MED ORDER — IPRATROPIUM-ALBUTEROL 0.5-2.5 (3) MG/3ML IN SOLN: 3.0000 mL | Freq: Four times a day (QID) | RESPIRATORY_TRACT | Status: DC | PRN

## 2014-09-10 MED ORDER — GI COCKTAIL ~~LOC~~
30.0000 mL | Freq: Four times a day (QID) | ORAL | Status: DC | PRN
  Filled 2014-09-10: qty 30

## 2014-09-10 NOTE — ED Notes (Signed)
Patient complains of chest pain and shortness of breath, placed on 2L via nasal cannula, conducted ekg and provided to Dr. Criss Alvine. Patient is currently wearing nitro paste, received aspirin with ems. MD acknowledges, gives verbal order for nitro tablets subling, and follow up with admitting MD.

## 2014-09-10 NOTE — ED Notes (Signed)
Dr. MulleCriselda Peachesalls back, provided update on patient status, 1 nitro tab given so far, chest pain no different, blood pressure changed from 179/64 to 151/62. EKG completed and given to dr. Criss Alvine, also available in computer. Bed changed from 2W to 3W. MD acknwledges, no new orders.

## 2014-09-10 NOTE — ED Notes (Addendum)
Spoke with receiving RN who wants Blood Glucose level in CMP 347 to be addressed; Will have Md paged to place orders; Pt has admission orders that addresses BS

## 2014-09-10 NOTE — ED Notes (Signed)
Notified bed placement patient having active chest pain, currently assigned to 2W, they will not accept active chest pain patients. Mary acknowledges, new bed assigment pending. Admitting MD paged to update.

## 2014-09-10 NOTE — ED Notes (Signed)
Attempt to call report to floor RN will call back

## 2014-09-10 NOTE — Consult Note (Signed)
Referring Physician:  Primary Physician: Primary Cardiologist: Reason for Consultation:    HPI:  Debbie [name] is a 64yo woman with PMH of DM2, HLD, CAD s/p CABG 9/ 2015, COPD, HTN, GERD whom we are asked to see for CP.   She has chronic chest discomfort almost all her life. Says it happens almost every day when she gets up to do something. Got a little better after CABG but not much. She has been on Ranexa for a long time. Her primary cardiologist started her on metoprolol but she stopped this 5 months ago due to severe fatigue.    Last week developed chest pain while having sex with her husband. Rated it 3/10. Two days ago while walking to the bedroom had 6-7/10 CP accompanied with radiation of the pain to her left arm/elbow.ECG with TWI  in anterior leads which are new since her pre CABG ECG in 2014. Her initial TnI is 0.04 which increased 0.18. Now pain free  Review of Systems:     Cardiac Review of Systems: {Y] = yes [ ]  = no  Chest Pain [  y  ]  Resting SOB [  y ] Exertional SOB  [ y ]  Orthopnea [  ]   Pedal Edema [ y  ]    Palpitations [  ] Syncope  [  ]   Presyncope [   ]  General Review of Systems: [Y] = yes [  ]=no Constitional: recent weight change [  ]; anorexia [  ]; fatigue [ y ]; nausea Cove.Etienne  ]; night sweats [  ]; fever [  ]; or chills [  ];                                                                     Eyes : blurred vision [  ]; diplopia [   ]; vision changes [  ];  Amaurosis fugax[  ]; Resp: cough [  ];  wheezing[  ];  hemoptysis[  ];  PND [  ];  GI:  gallstones[  ], vomiting[  ];  dysphagia[  ]; melena[  ];  hematochezia [  ]; heartburn[  y];   GU: kidney stones [  ]; hematuria[  ];   dysuria [  ];  nocturia[  ]; incontinence [  ];             Skin: rash, swelling[  ];, hair loss[  ];  peripheral edema[  ];  or itching[  ]; Musculosketetal: myalgias[  ];  joint swelling[  ];  joint erythema[  ];  joint pain[  y];  back pain[  ];  Heme/Lymph: bruising[  ];   bleeding[  ];  anemia[  ];  Neuro: TIA[  ];  headaches[  ];  stroke[  ];  vertigo[  ];  seizures[  ];   paresthesias[  ];  difficulty walking[  ];  Psych:depression[  ]; anxiety[  ];  Endocrine: diabetes[ y ];  thyroid dysfunction[  ];  Other:  Past Medical History  Diagnosis Date  . Diabetes mellitus   . Hypertension   . CAD (coronary artery disease)   . S/P CABG (coronary artery bypass graft)   . COPD (chronic obstructive pulmonary disease)   .  GERD (gastroesophageal reflux disease)     Medications Prior to Admission  Medication Sig Dispense Refill  . albuterol (PROAIR HFA) 108 (90 BASE) MCG/ACT inhaler Inhale 2 puffs into the lungs every 6 (six) hours as needed for wheezing or shortness of breath.    . Alirocumab (PRALUENT) 75 MG/ML SOPN Inject 1 Applicatorful into the skin every 14 (fourteen) days.    [name] amLODipine (NORVASC) 5 MG tablet Take 5 mg by mouth daily.    [name] aspirin EC 325 MG tablet Take 325 mg by mouth at bedtime.    . cholecalciferol (VITAMIN D) 1000 UNITS tablet Take 2,000 Units by mouth daily.    [name] CINNAMON PO Take 2,000 mg by mouth daily.     . hydrochlorothiazide (HYDRODIURIL) 25 MG tablet Take 25 mg by mouth daily.    . Insulin Glargine (LANTUS SOLOSTAR) 100 UNIT/ML Solostar Pen Inject 25 Units into the skin at bedtime.    . lansoprazole (PREVACID) 15 MG capsule Take 15 mg by mouth daily at 12 noon.    [name] lisinopril (PRINIVIL,ZESTRIL) 20 MG tablet Take 20 mg by mouth daily.    . Multiple Vitamin (MULTIVITAMIN WITH MINERALS) TABS tablet Take 2 tablets by mouth daily.    . ranolazine (RANEXA) 500 MG 12 hr tablet Take 500 mg by mouth every 12 (twelve) hours.       Melene Muller ON 09/15/2014] Alirocumab  1 Applicatorful Subcutaneous Q14 Days  . amLODipine  5 mg Oral Daily  . aspirin EC  325 mg Oral QHS  . cholecalciferol  2,000 Units Oral Daily  . enoxaparin (LOVENOX) injection  40 mg Subcutaneous Q24H  . insulin aspart  0-15 Units Subcutaneous TID WC  . insulin aspart   0-5 Units Subcutaneous QHS  . insulin glargine  15 Units Subcutaneous QHS  . lisinopril  20 mg Oral Daily  . multivitamin with minerals  2 tablet Oral Daily  . pantoprazole  20 mg Oral Daily  . ranolazine  500 mg Oral Q12H    Infusions:    Allergies  Allergen Reactions  . Hydrocodone Other (See Comments)    Hallucinations  . Metoprolol Other (See Comments)    Muscle weakness  . Norvasc [Amlodipine Besylate] Other (See Comments)    Heartburn  . Percodan [Oxycodone-Aspirin] Other (See Comments)    hallucination  . Statins Other (See Comments)    Muscles ache    History   Social History  . Marital Status: Married    Spouse Name: N/A  . Number of Children: N/A  . Years of Education: N/A   Occupational History  . Not on file.   Social History Main Topics  . Smoking status: Former Smoker    Quit date: 08/29/1988  . Smokeless tobacco: Not on file  . Alcohol Use: No  . Drug Use: No  . Sexual Activity: Not on file   Other Topics Concern  . Not on file   Social History Narrative    History reviewed. No pertinent family history.  PHYSICAL EXAM: Filed Vitals:   09/10/14 1406  BP: 121/53  Pulse: 69  Temp: 98.3 F (36.8 C)  Resp:      Intake/Output Summary (Last 24 hours) at 09/10/14 1504 Last data filed at 09/10/14 0926  Gross per 24 hour  Intake   1525 ml  Output   1050 ml  Net    475 ml    General:  Sitting in bed . No respiratory difficulty HEENT: normal Neck: supple. no JVD.  Carotids 2+ bilat; no bruits. No lymphadenopathy or thryomegaly appreciated. Cor: PMI nondisplaced. Regular rate & rhythm. 2/6 TR Lungs: clear Abdomen: obese soft, nontender, nondistended. No hepatosplenomegaly. No bruits or masses. Good bowel sounds. Extremities: no cyanosis, clubbing, rash, trace edema Neuro: alert & oriented x 3, cranial nerves grossly intact. moves all 4 extremities w/o difficulty. Flat affect.  ECG: NSR with TWI and nonspecific ST-T wave abnormalities.  v1-v4 new since 2014   Results for orders placed or performed during the hospital encounter of 09/09/14 (from the past 24 hour(s))  Basic metabolic panel     Status: Abnormal   Collection Time: 09/09/14 10:14 PM  Result Value Ref Range   Sodium 135 135 - 145 mmol/L   Potassium 4.0 3.5 - 5.1 mmol/L   Chloride 102 101 - 111 mmol/L   CO2 20 (L) 22 - 32 mmol/L   Glucose, Bld 347 (H) 65 - 99 mg/dL   BUN 15 6 - 20 mg/dL   Creatinine, Ser 1.61 0.44 - 1.00 mg/dL   Calcium 9.3 8.9 - 09.6 mg/dL   GFR calc non Af Amer >60 >60 mL/min   GFR calc Af Amer >60 >60 mL/min   Anion gap 13 5 - 15  CBC     Status: None   Collection Time: 09/09/14 10:14 PM  Result Value Ref Range   WBC 9.0 4.0 - 10.5 K/uL   RBC 4.74 3.87 - 5.11 MIL/uL   Hemoglobin 12.4 12.0 - 15.0 g/dL   HCT 04.5 40.9 - 81.1 %   MCV 78.9 78.0 - 100.0 fL   MCH 26.2 26.0 - 34.0 pg   MCHC 33.2 30.0 - 36.0 g/dL   RDW 91.4 78.2 - 95.6 %   Platelets 246 150 - 400 K/uL  Protime-INR - (order if Patient is taking Coumadin / Warfarin)     Status: None   Collection Time: 09/09/14 10:14 PM  Result Value Ref Range   Prothrombin Time 13.3 11.6 - 15.2 seconds   INR 0.99 0.00 - 1.49  Brain natriuretic peptide     Status: None   Collection Time: 09/09/14 10:14 PM  Result Value Ref Range   B Natriuretic Peptide 24.7 0.0 - 100.0 pg/mL  I-stat troponin, ED     Status: None   Collection Time: 09/09/14 10:25 PM  Result Value Ref Range   Troponin i, poc 0.04 0.00 - 0.08 ng/mL   Comment 3          Urinalysis, Routine w reflex microscopic (not at [facility])     Status: Abnormal   Collection Time: 09/09/14 11:20 PM  Result Value Ref Range   Color, Urine YELLOW YELLOW   APPearance CLOUDY (A) CLEAR   Specific Gravity, Urine 1.018 1.005 - 1.030   pH 6.0 5.0 - 8.0   Glucose, UA >1000 (A) NEGATIVE mg/dL   Hgb urine dipstick NEGATIVE NEGATIVE   Bilirubin Urine NEGATIVE NEGATIVE   Ketones, ur NEGATIVE NEGATIVE mg/dL   Protein, ur NEGATIVE NEGATIVE mg/dL     Urobilinogen, UA 0.2 0.0 - 1.0 mg/dL   Nitrite NEGATIVE NEGATIVE   Leukocytes, UA SMALL (A) NEGATIVE  Urine microscopic-add on     Status: None   Collection Time: 09/09/14 11:20 PM  Result Value Ref Range   Squamous Epithelial / LPF RARE RARE   WBC, UA 3-6 <3 WBC/hpf   RBC / HPF 0-2 <3 RBC/hpf   Bacteria, UA RARE RARE  Glucose, capillary     Status: Abnormal   Collection  Time: 09/10/14  1:41 AM  Result Value Ref Range   Glucose-Capillary 241 (H) 65 - 99 mg/dL  Troponin I-serum (0, 3, 6 hours)     Status: Abnormal   Collection Time: 09/10/14  4:00 AM  Result Value Ref Range   Troponin I 0.17 (H) <0.031 ng/mL  TSH     Status: None   Collection Time: 09/10/14  4:00 AM  Result Value Ref Range   TSH 1.550 0.350 - 4.500 uIU/mL  CBC     Status: Abnormal   Collection Time: 09/10/14  4:00 AM  Result Value Ref Range   WBC 9.3 4.0 - 10.5 K/uL   RBC 4.22 3.87 - 5.11 MIL/uL   Hemoglobin 11.1 (L) 12.0 - 15.0 g/dL   HCT 09.8 (L) 11.9 - 14.7 %   MCV 79.4 78.0 - 100.0 fL   MCH 26.3 26.0 - 34.0 pg   MCHC 33.1 30.0 - 36.0 g/dL   RDW 82.9 56.2 - 13.0 %   Platelets 210 150 - 400 K/uL  Creatinine, serum     Status: None   Collection Time: 09/10/14  4:00 AM  Result Value Ref Range   Creatinine, Ser 0.78 0.44 - 1.00 mg/dL   GFR calc non Af Amer >60 >60 mL/min   GFR calc Af Amer >60 >60 mL/min  Lipid panel     Status: Abnormal   Collection Time: 09/10/14  4:00 AM  Result Value Ref Range   Cholesterol 216 (H) 0 - 200 mg/dL   Triglycerides 865 <784 mg/dL   HDL 45 >69 mg/dL   Total CHOL/HDL Ratio 4.8 RATIO   VLDL 25 0 - 40 mg/dL   LDL Cholesterol 629 (H) 0 - 99 mg/dL  Glucose, capillary     Status: Abnormal   Collection Time: 09/10/14  7:57 AM  Result Value Ref Range   Glucose-Capillary 158 (H) 65 - 99 mg/dL  Troponin I-serum (0, 3, 6 hours)     Status: Abnormal   Collection Time: 09/10/14  8:45 AM  Result Value Ref Range   Troponin I 0.18 (H) <0.031 ng/mL  Glucose, capillary      Status: Abnormal   Collection Time: 09/10/14 11:41 AM  Result Value Ref Range   Glucose-Capillary 135 (H) 65 - 99 mg/dL   Dg Chest 2 View  07/15/4130   CLINICAL DATA:  Acute onset of left-sided chest pain and shortness of breath. Initial encounter.  EXAM: CHEST  2 VIEW  COMPARISON:  Chest radiograph performed 01/09/2013  FINDINGS: The lungs are well-aerated. Mild bibasilar airspace opacities may reflect atelectasis or mild pneumonia. There is no evidence of pleural effusion or pneumothorax.  The heart is borderline normal in size. The patient is status post median sternotomy. No acute osseous abnormalities are seen. The patient is status post left-sided rotator cuff repair.  IMPRESSION: Mild bibasilar airspace opacities may reflect atelectasis or mild pneumonia.   Electronically Signed   By: Roanna Raider M.D.   On: 09/09/2014 23:04     ASSESSMENT: 1. Chest pain with minimally elevated troponin  2. CAD s/p CABG 2015 3. DM2 4. HL  5. Chronic CP 6. Intolerance to b-blockers 7. Obesity   PLAN/DISCUSSION:  It is a bit difficult to sort through ms. Prichett's symptoms. She has had chronic CP which has not gotten much better with CABG. Her most recent episode seems a bit more severe than her typical symptoms. ECG shows new TWI anteriorly but we do not have any ECGs done after her  CABG to compare. I have suggested that she consider cardiac cath but she would prefer to start with a Myoview. Given that troponin is only minimally elevated and she is now pain free, I am quite comfortable with that approach. Continue ASA, NTG, statin. Would start heparin if CP recurs. She is intolerant of b-blockers.  Jadavion Spoelstra,MD 3:13 PM

## 2014-09-10 NOTE — Progress Notes (Addendum)
[facility] PROGRESS NOTE  Assessment/Plan: Unstable Angina: - EKG showed flipped T waves on the lateral leads, one set of troponins elevated. - In a patient with a cavitation 2015, with a history concerning for unstable angina. - Cardiology to see continue nitroglycerin for chest pain. - Does not tolerate beta blockers check hemoglobin A1c  DM2 (diabetes mellitus, type 2) - Check a hemoglobin A1c. Continue long-acting insulin plus sliding scale.  S/P CABG (coronary artery bypass graft) - Done over at [place] we'll try to get records.  Benign essential HTN - mildly elevated.  GERD (gastroesophageal reflux disease) PPI    Code Status: full Family Communication: daughter  Disposition Plan: inpatient   Consultants:  cardiology  Procedures:  ECHo pending  Antibiotics: none HPI/Subjective: No further chest pain.  Objective: Filed Vitals:   09/10/14 0100 09/10/14 0115 09/10/14 0140 09/10/14 0210  BP: 136/61 130/55 184/77 162/56  Pulse: 79 80 79 80  Temp:   98.2 F (36.8 C)   TempSrc:   Oral   Resp: Height:   5' (1.524 m)   Weight:   72.3 kg (159 lb 6.3 oz)   SpO2: 100% 100% 100% 100%    Intake/Output Summary (Last 24 hours) at 09/10/14 0810 Last data filed at 09/10/14 0345  Gross per 24 hour  Intake   1525 ml  Output    700 ml  Net    825 ml   Filed Weights   09/10/14 0140  Weight: 72.3 kg (159 lb 6.3 oz)    Exam:  General: Alert, awake, oriented x3, in no acute distress.  HEENT: No bruits, no goiter.  Heart: Regular rate and rhythm. Lungs: Good air movement,clear Abdomen: Soft, nontender, nondistended, positive bowel sounds.  Neuro: Grossly intact, nonfocal.   Data Reviewed: Basic Metabolic Panel:  Recent Labs Lab 09/09/14 2214 09/10/14 0400  NA 135  --   K 4.0  --   CL 102  --   CO2 20*  --   GLUCOSE 347*  --   BUN 15  --   CREATININE 0.89 0.78  CALCIUM 9.3  --    Liver Function Tests: No results for  input(s): AST, ALT, ALKPHOS, BILITOT, PROT, ALBUMIN in the last 168 hours. No results for input(s): LIPASE, AMYLASE in the last 168 hours. No results for input(s): AMMONIA in the last 168 hours. CBC:  Recent Labs Lab 09/09/14 2214 09/10/14 0400  WBC 9.0 9.3  HGB 12.4 11.1*  HCT 37.4 33.5*  MCV 78.9 79.4  PLT 246 210   Cardiac Enzymes:  Recent Labs Lab 09/10/14 0400  TROPONINI 0.17*   BNP (last 3 results)  Recent Labs  09/09/14 2214  BNP 24.7    ProBNP (last 3 results) No results for input(s): PROBNP in the last 8760 hours.  CBG:  Recent Labs Lab 09/10/14 0141 09/10/14 0757  GLUCAP 241* 158*    No results found for this or any previous visit (from the past 240 hour(s)).   Studies: Dg Chest 2 View  09/09/2014   CLINICAL DATA:  Acute onset of left-sided chest pain and shortness of breath. Initial encounter.  EXAM: CHEST  2 VIEW  COMPARISON:  Chest radiograph performed 01/09/2013  FINDINGS: The lungs are well-aerated. Mild bibasilar airspace opacities may reflect atelectasis or mild pneumonia. There is no evidence of pleural effusion or pneumothorax.  The heart is borderline normal in size. The patient is status post median sternotomy. No acute osseous abnormalities are seen.  The patient is status post left-sided rotator cuff repair.  IMPRESSION: Mild bibasilar airspace opacities may reflect atelectasis or mild pneumonia.   Electronically Signed   By: Roanna Raider M.D.   On: 09/09/2014 23:04    Scheduled Meds: . [START ON 09/15/2014] Alirocumab  1 Applicatorful Subcutaneous Q14 Days  . amLODipine  5 mg Oral Daily  . aspirin EC  325 mg Oral QHS  . cholecalciferol  2,000 Units Oral Daily  . enoxaparin (LOVENOX) injection  40 mg Subcutaneous Q24H  . insulin aspart  0-15 Units Subcutaneous TID WC  . insulin aspart  0-5 Units Subcutaneous QHS  . insulin glargine  15 Units Subcutaneous QHS  . lisinopril  20 mg Oral Daily  . multivitamin with minerals  2 tablet Oral  Daily  . pantoprazole  20 mg Oral Daily  . ranolazine  500 mg Oral Q12H   Continuous Infusions:   Time Spent: 25 min   Marinda Elk  [facility] Pager [phone]. If 7PM-7AM, please contact night-coverage at [email or web address], password [facility] 09/10/2014, 8:10 AM

## 2014-09-10 NOTE — ED Notes (Signed)
Patient declines last nitro tab at this time.

## 2014-09-11 ENCOUNTER — Ambulatory Visit (INDEPENDENT_AMBULATORY_CARE_PROVIDER_SITE_OTHER): Payer: 59

## 2014-09-11 ENCOUNTER — Encounter (HOSPITAL_COMMUNITY): Payer: 59

## 2014-09-11 DIAGNOSIS — I200 Unstable angina: Secondary | ICD-10-CM | POA: Diagnosis not present

## 2014-09-11 DIAGNOSIS — R079 Chest pain, unspecified: Secondary | ICD-10-CM

## 2014-09-11 DIAGNOSIS — E7849 Other hyperlipidemia: Secondary | ICD-10-CM | POA: Insufficient documentation

## 2014-09-11 DIAGNOSIS — R7989 Other specified abnormal findings of blood chemistry: Secondary | ICD-10-CM

## 2014-09-11 DIAGNOSIS — E785 Hyperlipidemia, unspecified: Secondary | ICD-10-CM | POA: Diagnosis present

## 2014-09-11 DIAGNOSIS — R778 Other specified abnormalities of plasma proteins: Secondary | ICD-10-CM | POA: Diagnosis present

## 2014-09-11 DIAGNOSIS — E119 Type 2 diabetes mellitus without complications: Secondary | ICD-10-CM | POA: Diagnosis not present

## 2014-09-11 LAB — GLUCOSE, CAPILLARY
GLUCOSE-CAPILLARY: 229 mg/dL — AB (ref 65–99)
GLUCOSE-CAPILLARY: 174 mg/dL — AB (ref 65–99)
Glucose-Capillary: 230 mg/dL — ABNORMAL HIGH (ref 65–99)
Glucose-Capillary: 180 mg/dL — ABNORMAL HIGH (ref 65–99)

## 2014-09-11 LAB — HEMOGLOBIN A1C
Hgb A1c MFr Bld: 8.7 % — ABNORMAL HIGH (ref 4.8–5.6)
MEAN PLASMA GLUCOSE: 203 mg/dL

## 2014-09-11 MED ORDER — NITROGLYCERIN 0.4 MG SL SUBL
0.4000 mg | SUBLINGUAL_TABLET | SUBLINGUAL | Status: DC | PRN
  Administered 2014-09-11 – 2014-09-13 (×3): 0.4 mg via SUBLINGUAL
  Filled 2014-09-11 (×3): qty 1

## 2014-09-11 MED ORDER — SODIUM CHLORIDE 0.9 % IV SOLN
INTRAVENOUS | Status: DC
  Administered 2014-09-12: 01:00:00 via INTRAVENOUS

## 2014-09-11 MED ORDER — SODIUM CHLORIDE 0.9 % IJ SOLN: 3.0000 mL | INTRAMUSCULAR | Status: DC | PRN

## 2014-09-11 MED ORDER — TECHNETIUM TC 99M SESTAMIBI GENERIC - CARDIOLITE
10.0000 | Freq: Once | INTRAVENOUS | Status: AC | PRN
  Administered 2014-09-11: 10 via INTRAVENOUS

## 2014-09-11 MED ORDER — NITROGLYCERIN 0.4 MG SL SUBL
SUBLINGUAL_TABLET | SUBLINGUAL | Status: AC
  Filled 2014-09-11: qty 1

## 2014-09-11 MED ORDER — AMLODIPINE BESYLATE 10 MG PO TABS
10.0000 mg | ORAL_TABLET | Freq: Every day | ORAL | Status: DC
  Administered 2014-09-11 – 2014-09-13 (×3): 10 mg via ORAL
  Filled 2014-09-11 (×4): qty 1

## 2014-09-11 MED ORDER — TECHNETIUM TC 99M SESTAMIBI - CARDIOLITE
30.0000 | Freq: Once | INTRAVENOUS | Status: AC | PRN
  Administered 2014-09-11: 09:00:00 30 via INTRAVENOUS

## 2014-09-11 MED ORDER — SODIUM CHLORIDE 0.9 % IV SOLN: 250.0000 mL | INTRAVENOUS | Status: DC | PRN

## 2014-09-11 MED ORDER — ASPIRIN 81 MG PO CHEW
81.0000 mg | CHEWABLE_TABLET | ORAL | Status: AC
  Administered 2014-09-12: 81 mg via ORAL
  Filled 2014-09-11: qty 1

## 2014-09-11 MED ORDER — SODIUM CHLORIDE 0.9 % IJ SOLN
3.0000 mL | Freq: Two times a day (BID) | INTRAMUSCULAR | Status: DC
  Administered 2014-09-11: 3 mL via INTRAVENOUS

## 2014-09-11 NOTE — Progress Notes (Signed)
C/o chest pain 6/10 radiates to L arm and shoulder, SL NTG given per order

## 2014-09-11 NOTE — Progress Notes (Signed)
  Echocardiogram 2D Echocardiogram has been performed.  [name] 09/11/2014, 2:56 PM

## 2014-09-11 NOTE — Progress Notes (Addendum)
Results for ANNALESE, STINER (MRN [id number]) as of 09/11/2014 14:52  Ref. Range 09/10/2014 16:44 09/10/2014 21:16 09/11/2014 10:24 09/11/2014 13:16  Glucose-Capillary Latest Ref Range: 65-99 mg/dL 409 (H) 811 (H) 914 (H) 229 (H)  Noted that CBGs continue to be greater than 180 mg/dl.  Takes Lantus 25 units daily at home.  Recommend increasing Lantus to 20 units daily if CBGs continue to be elevated.  Continue MODERATE correction scale TID&HS. Smith Mince RN BSN CDE

## 2014-09-11 NOTE — Progress Notes (Signed)
Patient presented for Treadmill stress test. Patient developed 6/10 chest pain, radiating to L arm during test. Associated with SOB. Pain improved after 1 X SL nitro.   Pending reading.   Tenicia Gural, PA-C

## 2014-09-11 NOTE — Progress Notes (Signed)
[facility] PROGRESS NOTE  Assessment/Plan: Unstable Angina: - EKG showed flipped T waves on the lateral leads, one set of troponins elevated. - for stress test on 7.25.2016, awaiting results. - Cardiology to see continue nitroglycerin for chest pain. - Does not tolerate beta blockers check hemoglobin A1c   DM2 (diabetes mellitus, type 2) - Check a hemoglobin A1c. Continue long-acting insulin plus sliding scale.  S/P CABG (coronary artery bypass graft) - Done over at [facility] we'll try to get records.  Benign essential HTN - Cont to be elevated, increase norvasc.  GERD (gastroesophageal reflux disease) PPI    Code Status: full Family Communication: daughter  Disposition Plan: inpatient   Consultants:  cardiology  Procedures:  ECHo pending  Antibiotics: none HPI/Subjective: No further chest pain.   Objective: Filed Vitals:   09/11/14 0917 09/11/14 0919 09/11/14 0924 09/11/14 0925  BP: 133/62 170/84 183/79 159/77  Pulse: 139 157 84 91  Temp:      TempSrc:      Resp:      Height:      Weight:      SpO2:        Intake/Output Summary (Last 24 hours) at 09/11/14 1024 Last data filed at 09/11/14 0800  Gross per 24 hour  Intake      0 ml  Output    950 ml  Net   -950 ml   Filed Weights   09/10/14 0140  Weight: 72.3 kg (159 lb 6.3 oz)    Exam:  General: Alert, awake, oriented x3, in no acute distress.  HEENT: No bruits, no goiter.  Heart: Regular rate and rhythm. Lungs: Good air movement,clear Abdomen: Soft, nontender, nondistended, positive bowel sounds.  Neuro: Grossly intact, nonfocal.   Data Reviewed: Basic Metabolic Panel:  Recent Labs Lab 09/09/14 2214 09/10/14 0400  NA 135  --   K 4.0  --   CL 102  --   CO2 20*  --   GLUCOSE 347*  --   BUN 15  --   CREATININE 0.89 0.78  CALCIUM 9.3  --    Liver Function Tests: No results for input(s): AST, ALT, ALKPHOS, BILITOT, PROT, ALBUMIN in the last 168 hours. No results for  input(s): LIPASE, AMYLASE in the last 168 hours. No results for input(s): AMMONIA in the last 168 hours. CBC:  Recent Labs Lab 09/09/14 2214 09/10/14 0400  WBC 9.0 9.3  HGB 12.4 11.1*  HCT 37.4 33.5*  MCV 78.9 79.4  PLT 246 210   Cardiac Enzymes:  Recent Labs Lab 09/10/14 0400 09/10/14 0845  TROPONINI 0.17* 0.18*   BNP (last 3 results)  Recent Labs  09/09/14 2214  BNP 24.7    ProBNP (last 3 results) No results for input(s): PROBNP in the last 8760 hours.  CBG:  Recent Labs Lab 09/10/14 0141 09/10/14 0757 09/10/14 1141 09/10/14 1644 09/10/14 2116  GLUCAP 241* 158* 135* 260* 246*    No results found for this or any previous visit (from the past 240 hour(s)).   Studies: Dg Chest 2 View  09/09/2014   CLINICAL DATA:  Acute onset of left-sided chest pain and shortness of breath. Initial encounter.  EXAM: CHEST  2 VIEW  COMPARISON:  Chest radiograph performed 01/09/2013  FINDINGS: The lungs are well-aerated. Mild bibasilar airspace opacities may reflect atelectasis or mild pneumonia. There is no evidence of pleural effusion or pneumothorax.  The heart is borderline normal in size. The patient is status post median sternotomy. No acute osseous  abnormalities are seen. The patient is status post left-sided rotator cuff repair.  IMPRESSION: Mild bibasilar airspace opacities may reflect atelectasis or mild pneumonia.   Electronically Signed   By: Roanna Raider M.D.   On: 09/09/2014 23:04    Scheduled Meds: . [START ON 09/15/2014] Alirocumab  1 Applicatorful Subcutaneous Q14 Days  . amLODipine  5 mg Oral Daily  . aspirin EC  325 mg Oral QHS  . cholecalciferol  2,000 Units Oral Daily  . enoxaparin (LOVENOX) injection  40 mg Subcutaneous Q24H  . insulin aspart  0-15 Units Subcutaneous TID WC  . insulin aspart  0-5 Units Subcutaneous QHS  . insulin glargine  15 Units Subcutaneous QHS  . lisinopril  20 mg Oral Daily  . multivitamin with minerals  2 tablet Oral Daily  .  nitroGLYCERIN      . pantoprazole  20 mg Oral Daily  . ranolazine  500 mg Oral Q12H   Continuous Infusions:   Time Spent: 25 min   Marinda Elk  [facility] Pager [phone]. If 7PM-7AM, please contact night-coverage at [email or web address], password [facility] 09/11/2014, 10:24 AM

## 2014-09-12 ENCOUNTER — Encounter (HOSPITAL_COMMUNITY): Payer: Self-pay | Admitting: Thoracic Surgery (Cardiothoracic Vascular Surgery)

## 2014-09-12 ENCOUNTER — Encounter (HOSPITAL_COMMUNITY)
Admission: EM | Disposition: A | Discharge status: Home / Self Care | Payer: Self-pay | Source: Home / Self Care | Attending: Internal Medicine

## 2014-09-12 DIAGNOSIS — Z885 Allergy status to narcotic agent status: Secondary | ICD-10-CM | POA: Diagnosis not present

## 2014-09-12 DIAGNOSIS — I10 Essential (primary) hypertension: Secondary | ICD-10-CM | POA: Diagnosis not present

## 2014-09-12 DIAGNOSIS — I25110 Atherosclerotic heart disease of native coronary artery with unstable angina pectoris: Secondary | ICD-10-CM

## 2014-09-12 DIAGNOSIS — R079 Chest pain, unspecified: Secondary | ICD-10-CM | POA: Diagnosis present

## 2014-09-12 DIAGNOSIS — E1165 Type 2 diabetes mellitus with hyperglycemia: Secondary | ICD-10-CM | POA: Diagnosis not present

## 2014-09-12 DIAGNOSIS — E1159 Type 2 diabetes mellitus with other circulatory complications: Secondary | ICD-10-CM | POA: Diagnosis not present

## 2014-09-12 DIAGNOSIS — I25710 Atherosclerosis of autologous vein coronary artery bypass graft(s) with unstable angina pectoris: Secondary | ICD-10-CM | POA: Diagnosis present

## 2014-09-12 DIAGNOSIS — E785 Hyperlipidemia, unspecified: Secondary | ICD-10-CM | POA: Diagnosis not present

## 2014-09-12 DIAGNOSIS — J449 Chronic obstructive pulmonary disease, unspecified: Secondary | ICD-10-CM | POA: Diagnosis present

## 2014-09-12 DIAGNOSIS — E669 Obesity, unspecified: Secondary | ICD-10-CM | POA: Diagnosis present

## 2014-09-12 DIAGNOSIS — R7989 Other specified abnormal findings of blood chemistry: Secondary | ICD-10-CM | POA: Diagnosis not present

## 2014-09-12 DIAGNOSIS — I200 Unstable angina: Secondary | ICD-10-CM | POA: Diagnosis not present

## 2014-09-12 DIAGNOSIS — Z888 Allergy status to other drugs, medicaments and biological substances status: Secondary | ICD-10-CM | POA: Diagnosis not present

## 2014-09-12 DIAGNOSIS — Z951 Presence of aortocoronary bypass graft: Secondary | ICD-10-CM | POA: Diagnosis not present

## 2014-09-12 DIAGNOSIS — I2582 Chronic total occlusion of coronary artery: Secondary | ICD-10-CM | POA: Diagnosis present

## 2014-09-12 DIAGNOSIS — Z6838 Body mass index (BMI) 38.0-38.9, adult: Secondary | ICD-10-CM | POA: Diagnosis not present

## 2014-09-12 DIAGNOSIS — Z791 Long term (current) use of non-steroidal anti-inflammatories (NSAID): Secondary | ICD-10-CM | POA: Diagnosis not present

## 2014-09-12 DIAGNOSIS — Z87891 Personal history of nicotine dependence: Secondary | ICD-10-CM | POA: Diagnosis not present

## 2014-09-12 DIAGNOSIS — K219 Gastro-esophageal reflux disease without esophagitis: Secondary | ICD-10-CM | POA: Diagnosis present

## 2014-09-12 HISTORY — PX: CARDIAC CATHETERIZATION: SHX172

## 2014-09-12 LAB — CBC
HCT: 33.5 % — ABNORMAL LOW (ref 36.0–46.0)
HEMOGLOBIN: 11.1 g/dL — AB (ref 12.0–15.0)
MCH: 26.7 pg (ref 26.0–34.0)
MCHC: 33.1 g/dL (ref 30.0–36.0)
MCV: 80.7 fL (ref 78.0–100.0)
PLATELETS: 209 10*3/uL (ref 150–400)
RBC: 4.15 MIL/uL (ref 3.87–5.11)
RDW: 15 % (ref 11.5–15.5)
WBC: 9.5 10*3/uL (ref 4.0–10.5)

## 2014-09-12 LAB — GLUCOSE, CAPILLARY
Glucose-Capillary: 172 mg/dL — ABNORMAL HIGH (ref 65–99)
Glucose-Capillary: 223 mg/dL — ABNORMAL HIGH (ref 65–99)
Glucose-Capillary: 212 mg/dL — ABNORMAL HIGH (ref 65–99)
Glucose-Capillary: 166 mg/dL — ABNORMAL HIGH (ref 65–99)

## 2014-09-12 LAB — BASIC METABOLIC PANEL
Anion gap: 8 (ref 5–15)
BUN: 14 mg/dL (ref 6–20)
CO2: 26 mmol/L (ref 22–32)
Calcium: 8.7 mg/dL — ABNORMAL LOW (ref 8.9–10.3)
Chloride: 103 mmol/L (ref 101–111)
Creatinine, Ser: 0.74 mg/dL (ref 0.44–1.00)
GFR calc Af Amer: >60 mL/min (ref >60)
Glucose, Bld: 281 mg/dL — ABNORMAL HIGH (ref 65–99)
Potassium: 4.3 mmol/L (ref 3.5–5.1)
Sodium: 137 mmol/L (ref 135–145)

## 2014-09-12 LAB — HEMOGLOBIN A1C
Hgb A1c MFr Bld: 9.1 % — ABNORMAL HIGH (ref 4.8–5.6)
MEAN PLASMA GLUCOSE: 214 mg/dL

## 2014-09-12 LAB — PROTIME-INR
INR: 1.11 (ref 0.00–1.49)
PROTHROMBIN TIME: 14.5 s (ref 11.6–15.2)

## 2014-09-12 LAB — HEPARIN LEVEL (UNFRACTIONATED): Heparin Unfractionated: 0.55 IU/mL (ref 0.30–0.70)

## 2014-09-12 SURGERY — LEFT HEART CATH AND CORONARY ANGIOGRAPHY

## 2014-09-12 MED ORDER — FENTANYL CITRATE (PF) 100 MCG/2ML IJ SOLN
INTRAMUSCULAR | Status: AC
  Filled 2014-09-12: qty 2

## 2014-09-12 MED ORDER — IOHEXOL 350 MG/ML SOLN
INTRAVENOUS | Status: DC | PRN
  Administered 2014-09-12: 100 mL via INTRAVENOUS
  Administered 2014-09-12: 125 mL via INTRAVENOUS

## 2014-09-12 MED ORDER — HEPARIN (PORCINE) IN NACL 100-0.45 UNIT/ML-% IJ SOLN
1000.0000 [IU]/h | INTRAMUSCULAR | Status: DC
  Administered 2014-09-12: 850 [IU]/h via INTRAVENOUS
  Filled 2014-09-12: qty 250

## 2014-09-12 MED ORDER — HEPARIN (PORCINE) IN NACL 2-0.9 UNIT/ML-% IJ SOLN
INTRAMUSCULAR | Status: AC
  Filled 2014-09-12: qty 1500

## 2014-09-12 MED ORDER — HEPARIN (PORCINE) IN NACL 100-0.45 UNIT/ML-% IJ SOLN
850.0000 [IU]/h | INTRAMUSCULAR | Status: DC
  Administered 2014-09-12: 850 [IU]/h via INTRAVENOUS
  Filled 2014-09-12: qty 250

## 2014-09-12 MED ORDER — HEPARIN SODIUM (PORCINE) 1000 UNIT/ML IJ SOLN
INTRAMUSCULAR | Status: AC
  Filled 2014-09-12: qty 1

## 2014-09-12 MED ORDER — ATORVASTATIN CALCIUM 80 MG PO TABS: 80.0000 mg | ORAL_TABLET | Freq: Every day | ORAL | Status: DC

## 2014-09-12 MED ORDER — VERAPAMIL HCL 2.5 MG/ML IV SOLN
INTRAVENOUS | Status: DC | PRN
  Administered 2014-09-12: 08:00:00 via INTRA_ARTERIAL

## 2014-09-12 MED ORDER — NITROGLYCERIN 1 MG/10 ML FOR IR/CATH LAB
INTRA_ARTERIAL | Status: AC
  Filled 2014-09-12: qty 10

## 2014-09-12 MED ORDER — MIDAZOLAM HCL 2 MG/2ML IJ SOLN
INTRAMUSCULAR | Status: AC
  Filled 2014-09-12: qty 2

## 2014-09-12 MED ORDER — ASPIRIN EC 325 MG PO TBEC: 325.0000 mg | DELAYED_RELEASE_TABLET | Freq: Every day | ORAL | Status: DC

## 2014-09-12 MED ORDER — SODIUM CHLORIDE 0.9 % IJ SOLN
3.0000 mL | Freq: Two times a day (BID) | INTRAMUSCULAR | Status: DC
  Administered 2014-09-13: 22:00:00 3 mL via INTRAVENOUS

## 2014-09-12 MED ORDER — LIDOCAINE HCL (PF) 1 % IJ SOLN
INTRAMUSCULAR | Status: DC | PRN
  Administered 2014-09-12: 2 mL via INTRADERMAL

## 2014-09-12 MED ORDER — HEPARIN BOLUS VIA INFUSION
3000.0000 [IU] | Freq: Once | INTRAVENOUS | Status: AC
  Administered 2014-09-12: 3000 [IU] via INTRAVENOUS
  Filled 2014-09-12: qty 3000

## 2014-09-12 MED ORDER — SODIUM CHLORIDE 0.9 % IV SOLN: 250.0000 mL | INTRAVENOUS | Status: DC | PRN

## 2014-09-12 MED ORDER — LIDOCAINE HCL (PF) 1 % IJ SOLN
INTRAMUSCULAR | Status: AC
  Filled 2014-09-12: qty 30

## 2014-09-12 MED ORDER — SODIUM CHLORIDE 0.9 % WEIGHT BASED INFUSION
1.0000 mL/kg/h | INTRAVENOUS | Status: AC
  Administered 2014-09-12: 1 mL/kg/h via INTRAVENOUS

## 2014-09-12 MED ORDER — INSULIN GLARGINE 100 UNIT/ML ~~LOC~~ SOLN
10.0000 [IU] | Freq: Two times a day (BID) | SUBCUTANEOUS | Status: DC
  Administered 2014-09-12 – 2014-09-13 (×3): 10 [IU] via SUBCUTANEOUS
  Filled 2014-09-12 (×4): qty 0.1

## 2014-09-12 MED ORDER — LIDOCAINE HCL (PF) 1 % IJ SOLN
INTRAMUSCULAR | Status: DC | PRN
  Administered 2014-09-12: 08:00:00

## 2014-09-12 MED ORDER — VERAPAMIL HCL 2.5 MG/ML IV SOLN
INTRAVENOUS | Status: AC
  Filled 2014-09-12: qty 2

## 2014-09-12 MED ORDER — MIDAZOLAM HCL 2 MG/2ML IJ SOLN
INTRAMUSCULAR | Status: DC | PRN
  Administered 2014-09-12 (×2): 1 mg via INTRAVENOUS

## 2014-09-12 MED ORDER — HEPARIN SODIUM (PORCINE) 1000 UNIT/ML IJ SOLN
INTRAMUSCULAR | Status: DC | PRN
  Administered 2014-09-12: 4000 [IU] via INTRAVENOUS

## 2014-09-12 MED ORDER — FENTANYL CITRATE (PF) 100 MCG/2ML IJ SOLN
INTRAMUSCULAR | Status: DC | PRN
  Administered 2014-09-12 (×2): 50 ug via INTRAVENOUS

## 2014-09-12 MED ORDER — SODIUM CHLORIDE 0.9 % IJ SOLN: 3.0000 mL | INTRAMUSCULAR | Status: DC | PRN

## 2014-09-12 SURGICAL SUPPLY — 12 items
CATH EXPO 5F MPA-1 (CATHETERS) ×3 IMPLANT
CATH INFINITI 5 FR JL3.5 (CATHETERS) ×3 IMPLANT
CATH INFINITI 5FR JL4 (CATHETERS) ×3 IMPLANT
CATH INFINITI JR4 5F (CATHETERS) ×3 IMPLANT
DEVICE RAD COMP TR BAND LRG (VASCULAR PRODUCTS) ×3 IMPLANT
GLIDESHEATH SLEND A-KIT 6F 22G (SHEATH) ×3 IMPLANT
KIT HEART LEFT (KITS) ×3 IMPLANT
PACK CARDIAC CATHETERIZATION (CUSTOM PROCEDURE TRAY) ×3 IMPLANT
TRANSDUCER W/STOPCOCK (MISCELLANEOUS) ×3 IMPLANT
TUBING CIL FLEX 10 FLL-RA (TUBING) ×3 IMPLANT
WIRE HI TORQ VERSACORE-J 145CM (WIRE) ×3 IMPLANT
WIRE SAFE-T 1.5MM-J .035X260CM (WIRE) ×3 IMPLANT

## 2014-09-12 NOTE — Progress Notes (Signed)
[facility] PROGRESS NOTE  Assessment/Plan: Unstable Angina: - EKG showed flipped T waves on the lateral leads, one set of troponins elevated. - For stress test on 7.25.2016 showed Moderate-sized moderate severity lateral wall perfusion defect on stress images compatible with inducible ischemia. - Cardiology recommended cardiac cath on 7 26,016. - Does not tolerate beta blockers check hemoglobin A1c. Echo as below.  DM2 (diabetes mellitus, type 2) - Hemoglobin A1c was 8.7. Continue to titrate insulins as needed.  S/P CABG (coronary artery bypass graft) - Done over at [place] we'll try to get records.  Benign essential HTN: - Cont to be elevated, increase norvasc.  GERD (gastroesophageal reflux disease) PPI    Code Status: full Family Communication: daughter  Disposition Plan: inpatient   Consultants:  cardiology  Procedures:  ECHo 7.26.2016:   Antibiotics: none HPI/Subjective:  some chest pain overnight resolved with nitroglycerin.veCeasar Mons Vitals:   09/12/14 1191 09/12/14 0849 09/12/14 0854 09/12/14 0859  BP: 115/52 112/63    Pulse: 79 71 0 137  Temp:      TempSrc:      Resp: 15 14 11  77  Height:      Weight:      SpO2: 100% 100% 0% 0%    Intake/Output Summary (Last 24 hours) at 09/12/14 0951 Last data filed at 09/12/14 0603  Gross per 24 hour  Intake    360 ml  Output   1650 ml  Net  -1290 ml   Filed Weights   09/10/14 0140 09/12/14 0558  Weight: 72.3 kg (159 lb 6.3 oz) 71.895 kg (158 lb 8 oz)    Exam:  General: Alert, awake, oriented x3, in no acute distress.  HEENT: No bruits, no goiter.  Heart: Regular rate and rhythm. Lungs: Good air movement,clear Abdomen: Soft, nontender, nondistended, positive bowel sounds.  Neuro: Grossly intact, nonfocal.   Data Reviewed: Basic Metabolic Panel:  Recent Labs Lab 09/09/14 2214 09/10/14 0400 09/12/14 0428  NA 135  --  137  K 4.0  --  4.3  CL 102  --  103  CO2 20*  --  26    GLUCOSE 347*  --  281*  BUN 15  --  14  CREATININE 0.89 0.78 0.74  CALCIUM 9.3  --  8.7*   Liver Function Tests: No results for input(s): AST, ALT, ALKPHOS, BILITOT, PROT, ALBUMIN in the last 168 hours. No results for input(s): LIPASE, AMYLASE in the last 168 hours. No results for input(s): AMMONIA in the last 168 hours. CBC:  Recent Labs Lab 09/09/14 2214 09/10/14 0400 09/12/14 0428  WBC 9.0 9.3 9.5  HGB 12.4 11.1* 11.1*  HCT 37.4 33.5* 33.5*  MCV 78.9 79.4 80.7  PLT 246 210 209   Cardiac Enzymes:  Recent Labs Lab 09/10/14 0400 09/10/14 0845  TROPONINI 0.17* 0.18*   BNP (last 3 results)  Recent Labs  09/09/14 2214  BNP 24.7    ProBNP (last 3 results) No results for input(s): PROBNP in the last 8760 hours.  CBG:  Recent Labs Lab 09/11/14 1024 09/11/14 1316 09/11/14 1654 09/11/14 2110 09/12/14 0914  GLUCAP 230* 229* 174* 180* 172*    No results found for this or any previous visit (from the past 240 hour(s)).   Studies: Nm Myocar Multi W/spect W/wall Motion / Ef  09/11/2014   CLINICAL DATA:  Chest pain. CABG. Diabetes. Hypertension. Coronary artery disease. Shortness of breath.  EXAM: MYOCARDIAL IMAGING WITH SPECT (REST AND EXERCISE)  GATED LEFT VENTRICULAR WALL MOTION  STUDY  LEFT VENTRICULAR EJECTION FRACTION  TECHNIQUE: Standard myocardial SPECT imaging was performed after resting intravenous injection of 10 mCi Tc-9m sestamibi. Subsequently, exercise tolerance test was performed by the patient under the supervision of the Cardiology staff. At peak-stress, 30 mCi Tc-47m sestamibi was injected intravenously and standard myocardial SPECT imaging was performed. Quantitative gated imaging was also performed to evaluate left ventricular wall motion, and estimate left ventricular ejection fraction.  COMPARISON:  01/10/2013  FINDINGS: Perfusion: Moderate sized moderate severity lateral wall inducible ischemia defect on stress images.  Wall Motion: Akinetic septum  with associated poor wall thickening.  Left Ventricular Ejection Fraction: 48 %  End diastolic volume 81 ml  End systolic volume 43 ml  IMPRESSION: 1. Moderate-sized moderate severity lateral wall perfusion defect on stress images compatible with inducible ischemia.  .  2. A kinetics septum with associated poor wall thickening.  3. Left ventricular ejection fraction forty-eight%  4. Intermediate-risk stress test findings*.  *2012 Appropriate Use Criteria for Coronary Revascularization Focused Update: J Am Coll Cardiol. 2012;59(9):857-881. http://content.dementiazones.com.aspx?articleid=1201161   Electronically Signed   By: Gaylyn Rong M.D.   On: 09/11/2014 13:26    Scheduled Meds: . [START ON 09/15/2014] Alirocumab  1 Applicatorful Subcutaneous Q14 Days  . amLODipine  10 mg Oral Daily  . aspirin EC  325 mg Oral QHS  . atorvastatin  80 mg Oral q1800  . cholecalciferol  2,000 Units Oral Daily  . insulin aspart  0-15 Units Subcutaneous TID WC  . insulin aspart  0-5 Units Subcutaneous QHS  . insulin glargine  15 Units Subcutaneous QHS  . lisinopril  20 mg Oral Daily  . multivitamin with minerals  2 tablet Oral Daily  . pantoprazole  20 mg Oral Daily  . ranolazine  500 mg Oral Q12H  . sodium chloride  3 mL Intravenous Q12H   Continuous Infusions: . sodium chloride      Time Spent: 25 min   Marinda Elk  [facility] Pager [phone]. If 7PM-7AM, please contact night-coverage at [email or web address], password [email or web address] 09/12/2014, 9:51 AM

## 2014-09-12 NOTE — Interval H&P Note (Signed)
Cath Lab Visit (complete for each Cath Lab visit)  Clinical Evaluation Leading to the Procedure:   ACS: Yes.    Non-ACS:    Anginal Classification: CCS III  Anti-ischemic medical therapy: Maximal Therapy (2 or more classes of medications)  Non-Invasive Test Results: Intermediate-risk stress test findings: cardiac mortality 1-3%/year  Prior CABG: Previous CABG      History and Physical Interval Note:  09/12/2014 7:51 AM  Debbie [name]  has presented today for surgery, with the diagnosis of abnormal stress test  The various methods of treatment have been discussed with the patient and family. After consideration of risks, benefits and other options for treatment, the patient has consented to  Procedure(s): Left Heart Cath and Coronary Angiography (N/A) as a surgical intervention .  The patient's history has been reviewed, patient examined, no change in status, stable for surgery.  I have reviewed the patient's chart and labs.  Questions were answered to the patient's satisfaction.     Lesleigh Noe

## 2014-09-12 NOTE — Progress Notes (Signed)
Pt to have cardiac cath tomorrow. Cardiology note says to restart Heparin if pt has CP. Tonight, pt had one episode of 10/10 CP relieved by one nitro SL. This NP paged cardio fellow, Dr. Ronelle Nigh who agreed with plan.  Jimmye Norman, NP [facility]

## 2014-09-12 NOTE — Consult Note (Signed)
[address]       Debbie [name] [address]             [phone]          CARDIOTHORACIC SURGERY CONSULTATION REPORT  PCP is Pcp Not In System Referring Provider is Veatrice Kells, MD   Reason for consultation:  Left main disease, 3-vessel CAD and vein graft disease s/p CABG with unstable angina  HPI:  Patient is a 64 year old female with history of coronary artery disease status post coronary artery bypass grafting 4 in 2015, hypertension, type 2 diabetes mellitus, hyperlipidemia, and COPD with been referred for surgical consultation to discuss treatment options for management of coronary artery disease with vein graft disease and unstable angina. The patient states that she first began to experience symptoms of chest pain consistent with angina pectoris approximately 1-1/2 years ago. She was initially evaluated at [facility], but no diagnostic tests were ordered and the patient was frustrated with what she perceived to be poor care. She referred herself to a cardiologist in [facility] and ultimately underwent diagnostic cardiac catheterization demonstrating left main disease and three-vessel coronary artery disease. She underwent coronary artery bypass grafting 4 by Dr. Linna Darner at [facility] on 10/28/2013. Grafts placed at the time of surgery included left internal mammary artery to the distal left anterior descending coronary artery, saphenous vein graft to the intermediate branch, saphenous vein graft to the obtuse marginal branch, and saphenous vein graft to the distal right coronary artery. The operative note mentions that the patient was felt to have good quality target vessels for grafting and the left internal mammary artery and saphenous vein were all felt to be good quality conduit.  The patient recovered uneventfully and reportedly did quite well for approximately 2 months. However, she then began to  experience recurrent symptoms of exertional chest pain and shortness of breath. Symptoms have continued to gradually progress over the past 8 months. The patient now gets frequent episodes of substernal chest pain associated with shortness of breath with mild activity, and last night she had an episode of chest discomfort that awoke her from her sleep.  She presented to the emergency department where EKG revealed T-wave inversion that were reportedly new in comparison with old EKGs. Troponin level were mildly elevated, consistent with unstable angina pectoris. The patient underwent diagnostic cardiac catheterization earlier today by Dr. Katrinka Blazing demonstrating left main disease with three-vessel coronary artery disease, patent left internal mammary artery graft, occlusion of the vein graft to the intermediate branch, and severe disease involving the other 2 remaining vein grafts including high-grade proximal stenosis of the vein graft to the obtuse marginal branch and long segment diffuse stenosis of the vein graft to the right coronary artery. Left ventricular function remains preserved. Cardiothoracic surgical consultation was requested to discuss treatment options.  The patient is married and lives with her husband in [place].  She has been reasonably active and functionally independent until last 2 years during which time she has been limited by angina pectoris. She reports stable symptoms of mild exertional shortness of breath. In the absence of symptoms of chest pain she denies any history of resting shortness of breath or orthopnea. She occasionally has mild lower extremity edema and she denies any history of easy spells, palpitations, or syncope.  Past Medical History  Diagnosis Date  . Diabetes mellitus   . Hypertension   . CAD (coronary artery disease)   .  COPD (chronic obstructive pulmonary disease)   . GERD (gastroesophageal reflux disease)   . S/P CABG x 4 10/28/2013    LIMA to LAD, SVG to  ramus, SVG to OM, SVG to RCA, EVH via left thigh and leg - Dr. Linna Darner at [facility]     Past Surgical History  Procedure Laterality Date  . Coronary artery bypass graft  10/28/2013    CABG x4 using LIMA to LAD, SVG to ramus, SVG to OM, SVG to RCA, EVH via left thigh and leg - Dr. Linna Darner  . Abdominal hysterectomy      History reviewed. No pertinent family history.  History   Social History  . Marital Status: Married    Spouse Name: N/A  . Number of Children: N/A  . Years of Education: N/A   Occupational History  . Not on file.   Social History Main Topics  . Smoking status: Former Smoker    Quit date: 08/29/1988  . Smokeless tobacco: Not on file  . Alcohol Use: No  . Drug Use: No  . Sexual Activity: Not on file   Other Topics Concern  . Not on file   Social History Narrative    Prior to Admission medications   Medication Sig Start Date End Date Taking? Authorizing Provider  albuterol (PROAIR HFA) 108 (90 BASE) MCG/ACT inhaler Inhale 2 puffs into the lungs every 6 (six) hours as needed for wheezing or shortness of breath.   Yes Historical Provider, MD  Alirocumab (PRALUENT) 75 MG/ML SOPN Inject 1 Applicatorful into the skin every 14 (fourteen) days.   Yes Historical Provider, MD  amLODipine (NORVASC) 5 MG tablet Take 5 mg by mouth daily.   Yes Historical Provider, MD  aspirin EC 325 MG tablet Take 325 mg by mouth at bedtime.   Yes Historical Provider, MD  cholecalciferol (VITAMIN D) 1000 UNITS tablet Take 2,000 Units by mouth daily.   Yes Historical Provider, MD  CINNAMON PO Take 2,000 mg by mouth daily.    Yes Historical Provider, MD  hydrochlorothiazide (HYDRODIURIL) 25 MG tablet Take 25 mg by mouth daily.   Yes Historical Provider, MD  Insulin Glargine (LANTUS SOLOSTAR) 100 UNIT/ML Solostar Pen Inject 25 Units into the skin at bedtime.   Yes Historical Provider, MD  lansoprazole (PREVACID) 15 MG capsule Take 15 mg by mouth daily at 12 noon.    Yes Historical Provider, MD  lisinopril (PRINIVIL,ZESTRIL) 20 MG tablet Take 20 mg by mouth daily.   Yes Historical Provider, MD  Multiple Vitamin (MULTIVITAMIN WITH MINERALS) TABS tablet Take 2 tablets by mouth daily.   Yes Historical Provider, MD  ranolazine (RANEXA) 500 MG 12 hr tablet Take 500 mg by mouth every 12 (twelve) hours.   Yes Historical Provider, MD    Current Facility-Administered Medications  Medication Dose Route Frequency Provider Last Rate Last Dose  . 0.9 %  sodium chloride infusion  250 mL Intravenous PRN Lyn Records, MD      . acetaminophen (TYLENOL) tablet 650 mg  650 mg Oral Q4H PRN Inez Catalina, MD   650 mg at 09/12/14 1150  . [START ON 09/15/2014] Alirocumab SOPN 1 Applicatorful  1 Applicatorful Subcutaneous Q14 Days Inez Catalina, MD      . amLODipine (NORVASC) tablet 10 mg  10 mg Oral Daily Marinda Elk, MD   10 mg at 09/12/14 1105  . aspirin EC tablet 325 mg  325 mg Oral QHS Inez Catalina, MD  325 mg at 09/11/14 2142  . cholecalciferol (VITAMIN D) tablet 2,000 Units  2,000 Units Oral Daily Inez Catalina, MD   2,000 Units at 09/12/14 1105  . gi cocktail (Maalox,Lidocaine,Donnatal)  30 mL Oral QID PRN Inez Catalina, MD      . heparin ADULT infusion 100 units/mL (25000 units/250 mL)  850 Units/hr Intravenous Continuous Belinda Fisher Stone, RPH 8.5 mL/hr at 09/12/14 1758 850 Units/hr at 09/12/14 1758  . insulin aspart (novoLOG) injection 0-15 Units  0-15 Units Subcutaneous TID WC Inez Catalina, MD   5 Units at 09/12/14 1758  . insulin aspart (novoLOG) injection 0-5 Units  0-5 Units Subcutaneous QHS Inez Catalina, MD   2 Units at 09/10/14 2128  . insulin glargine (LANTUS) injection 10 Units  10 Units Subcutaneous BID Marinda Elk, MD   10 Units at 09/12/14 1107  . ipratropium-albuterol (DUONEB) 0.5-2.5 (3) MG/3ML nebulizer solution 3 mL  3 mL Nebulization Q6H PRN Inez Catalina, MD      . lisinopril (PRINIVIL,ZESTRIL) tablet 20 mg  20 mg Oral Daily Inez Catalina, MD   20 mg at 09/12/14 1106  . morphine 2 MG/ML injection 2 mg  2 mg Intravenous Q2H PRN Inez Catalina, MD      . multivitamin with minerals tablet 2 tablet  2 tablet Oral Daily Inez Catalina, MD   2 tablet at 09/12/14 1105  . nitroGLYCERIN (NITROSTAT) SL tablet 0.4 mg  0.4 mg Sublingual Q5 min PRN Bhavin Bhagat, PA   0.4 mg at 09/11/14 0922  . ondansetron (ZOFRAN) injection 4 mg  4 mg Intravenous Q6H PRN Inez Catalina, MD   4 mg at 09/12/14 1236  . pantoprazole (PROTONIX) EC tablet 20 mg  20 mg Oral Daily Inez Catalina, MD   20 mg at 09/12/14 1106  . ranolazine (RANEXA) 12 hr tablet 500 mg  500 mg Oral Q12H Inez Catalina, MD   500 mg at 09/12/14 1105  . sodium chloride 0.9 % injection 3 mL  3 mL Intravenous Q12H Lyn Records, MD   3 mL at 09/12/14 1239  . sodium chloride 0.9 % injection 3 mL  3 mL Intravenous PRN Lyn Records, MD        Allergies  Allergen Reactions  . Hydrocodone Other (See Comments)    Hallucinations  . Metoprolol Other (See Comments)    Muscle weakness  . Norvasc [Amlodipine Besylate] Other (See Comments)    Heartburn  . Percodan [Oxycodone-Aspirin] Other (See Comments)    hallucination  . Statins Other (See Comments)    Muscles ache      Review of Systems:   General:  normal appetite, decreased energy, + weight gain, no weight loss, no fever  Cardiac:  + chest pain with exertion, + chest pain at rest, + SOB with exertion, no resting SOB, no PND, no orthopnea, no palpitations, no arrhythmia, no atrial fibrillation, + LE edema, no dizzy spells, no syncope  Respiratory:  + shortness of breath, no home oxygen, no productive cough, no dry cough, no bronchitis, no wheezing, no hemoptysis, no asthma, no pain with inspiration or cough, no sleep apnea, no CPAP at night  GI:   no difficulty swallowing, + reflux, + frequent heartburn, no hiatal hernia, no abdominal pain, no constipation, no diarrhea, no hematochezia, no hematemesis, no melena  GU:   no  dysuria,  no frequency, no urinary tract infection, no hematuria, no kidney stones, no  kidney disease  Vascular:  no pain suggestive of claudication, + pain in feet, no leg cramps, no varicose veins, no DVT, no non-healing foot ulcer  Neuro:   no stroke, no TIA's, no seizures, no headaches, no temporary blindness one eye,  no slurred speech, no peripheral neuropathy, no chronic pain, no instability of gait, no memory/cognitive dysfunction  Musculoskeletal: no arthritis , no joint swelling, no myalgias, mild difficulty walking due to bone spur on foot, normal mobility   Skin:   no rash, no itching, no skin infections, no pressure sores or ulcerations  Psych:   no anxiety, no depression, no nervousness, no unusual recent stress  Eyes:   no blurry vision, no floaters, no recent vision changes, + wears glasses or contacts  ENT:   no hearing loss, no loose or painful teeth, no dentures  Hematologic:  no easy bruising, no abnormal bleeding, no clotting disorder, no frequent epistaxis  Endocrine:  + diabetes, does check CBG's at home     Physical Exam:   BP 133/53 mmHg  Pulse 70  Temp(Src) 97.8 F (36.6 C) (Oral)  Resp 77  Ht 5' (1.524 m)  Wt 71.895 kg (158 lb 8 oz)  BMI 30.95 kg/m2  SpO2 97%  General:  Mildly obese,  well-appearing  HEENT:  Unremarkable   Neck:   no JVD, no bruits, no adenopathy   Chest:   clear to auscultation, symmetrical breath sounds, no wheezes, no rhonchi   CV:   RRR, no  murmur   Abdomen:  soft, non-tender, no masses   Extremities:  warm, well-perfused, pulses palpable, trace lower extremity edema  Rectal/GU  Deferred  Neuro:   Grossly non-focal and symmetrical throughout  Skin:   Clean and dry, no rashes, no breakdown  Diagnostic Tests:  CARDIAC CATHETERIZATION  Conclusion     Bypass graft failure with total occlusion of the saphenous vein graft to the first diagonal (high ramus), 95% ostial stenosis of the saphenous vein graft to the ramus intermedius, and  90% proximal stenosis of the saphenous vein graft to the distal RCA.  Patent LIMA to LAD with mid vessel 40% narrowing.  Severe native vessel coronary disease with segmental 90% mid RCA stenosis, 85% proximal left main stenosis, segmental 80% stenosis and diffuse disease in the first diagonal (high ramus), 50% mid ramus intermedius, and 90% ostial circumflex (which supplies a very small lateral wall territory).  Overall normal left ventricular function. Estimated ejection fraction is 55%.    Recommendations:   Heart team approach to include reconsultation by cardiac surgery (doubt it would be prudent to repeat surgery given the patent LIMA to LAD). If we decided against repeat coronary bypass grafting, we would then pursue an interventional approach that should include drug-eluting stent to the mid right coronary. This could then be followed by angioplasty of the ostial stenosis in the saphenous vein graft to the ramus intermedius and stenting of the proximal left main stenosis. I do not believe that an interventional therapy is possible in the diffusely diseased first diagonal (high ramus) nor the ostium of the native circumflex (which supplies a small territory distally).  Will discuss with the patient, surgical, and other interventional colleagues before proceeding further.  Patient is also considering whether to complete treatment here or at [facility] where she receives her primary cardiology care.    Coronary Findings    Dominance: Right   Left Main   . LM lesion, 85% stenosed. The lesion is type C eccentric .  Left Anterior Descending   . Prox LAD to Mid LAD lesion, 50% stenosed.   . First Diagonal Branch   The vessel is small in size.   Suezanne Jacquet 1st Diag to 1st Diag lesion, 80% stenosed. diffuse .     Left Circumflex  The vessel is small .   [name] Ost Cx lesion, 90% stenosed. The lesion is type C .   [name] First Obtuse Marginal Branch   The vessel is small in size.   [name]  Second Obtuse Marginal Branch   The vessel is small in size.     Right Coronary Artery   . Mid RCA lesion, 90% stenosed. The lesion is type C tubular .   [name] Right Posterior Atrioventricular Branch   The vessel is small in size.   . First Right Posterolateral   The vessel is small in size.   [name] Second Right Posterolateral   The vessel is small in size.     Graft Angiography    Free Graft to Dist RCA  SVG   . Origin to Prox Graft lesion, 90% stenosed.     Graft to 1st Diag   . Prox Graft lesion, 100% stenosed.     Free LIMA Graft to Dist LAD  LIMA   . Mid Graft lesion, 40% stenosed.     Free Graft to Ramus  SVG   . Origin lesion, 95% stenosed. The lesion is type C .          Wall Motion                 Left Heart    Left Ventricle The left ventricular size is normal.    Coronary Diagrams    Diagnostic Diagram            Implants    Name ID Temporary Type Supply   No information to display    Hemo Data       Most Recent Value   AO Systolic Pressure  136 mmHg   AO Diastolic Pressure  55 mmHg   AO Mean  84 mmHg   LV Systolic Pressure  137 mmHg   LV Diastolic Pressure  14 mmHg   LV EDP  17 mmHg   Arterial Occlusion Pressure Extended Systolic Pressure  138 mmHg   Arterial Occlusion Pressure Extended Diastolic Pressure  59 mmHg   Arterial Occlusion Pressure Extended Mean Pressure  91 mmHg   Left Ventricular Apex Extended Systolic Pressure  136 mmHg   Left Ventricular Apex Extended Diastolic Pressure  5 mmHg   Left Ventricular Apex Extended EDP Pressure  16 mmHg     Impression:  Patient has left main disease with three-vessel coronary artery disease and vein graft disease with premature graft failure status post coronary artery bypass grafting 4 in September 2015. She presents with symptoms consistent with unstable angina pectoris. Left ventricular systolic function is preserved. I have personally reviewed the patient's  diagnostic cardiac catheterization. There are no signs of technical complications related to the patient's original surgery. The premature stenosis and occlusion involving all 3 of the previous vein graft suggests the possibility of problems related to vein procurement.     Plan:  Ultimately the patient may require redo coronary artery bypass grafting in the future, but at the present time I would favor a strategy using multivessel PCI and stenting.  This could include PCI and stenting of the short segment high-grade proximal stenosis of the large vein graft to the  obtuse marginal branch and PCI and stenting of the native right coronary artery.  PCI and stenting of the left main coronary artery could also be considered given continued patency of the left internal mammary artery graft to the distal left anterior descending coronary artery, although this may not be necessary.  I have discussed matters at length with the patient and her family this afternoon. Alternative treatment strategies of been discussed. The importance of long-term risk factor modification on the long-term impact of her underlying disease process has been stressed. All of her questions have been addressed. Please call if we can be of further assistance.   I spent in excess of 90 minutes during the conduct of this hospital consultation and >50% of this time involved direct face-to-face encounter for counseling and/or coordination of the patient's care.   Salvatore Decent. Cornelius Moras, MD 09/12/2014 7:38 PM

## 2014-09-12 NOTE — H&P (View-Only) (Signed)
  Referring Physician:  Primary Physician: Primary Cardiologist: Reason for Consultation:    HPI:  Ms. Debbie [name] is a 64yo woman with PMH of DM2, HLD, CAD s/p CABG 9/ 2015, COPD, HTN, GERD whom we are asked to see for CP.   She has chronic chest discomfort almost all her life. Says it happens almost every day when she gets up to do something. Got a little better after CABG but not much. She has been on Ranexa for a long time. Her primary cardiologist started her on metoprolol but she stopped this 5 months ago due to severe fatigue.    Last week developed chest pain while having sex with her husband. Rated it 3/10. Two days ago while walking to the bedroom had 6-7/10 CP accompanied with radiation of the pain to her left arm/elbow.ECG with TWI  in anterior leads which are new since her pre CABG ECG in 2014. Her initial TnI is 0.04 which increased 0.18. Now pain free  Review of Systems:     Cardiac Review of Systems: {Y] = yes [ ] = no  Chest Pain [  y  ]  Resting SOB [  y ] Exertional SOB  [ y ]  Orthopnea [  ]   Pedal Edema [ y  ]    Palpitations [  ] Syncope  [  ]   Presyncope [   ]  General Review of Systems: [Y] = yes [  ]=no Constitional: recent weight change [  ]; anorexia [  ]; fatigue [ y ]; nausea [y  ]; night sweats [  ]; fever [  ]; or chills [  ];                                                                     Eyes : blurred vision [  ]; diplopia [   ]; vision changes [  ];  Amaurosis fugax[  ]; Resp: cough [  ];  wheezing[  ];  hemoptysis[  ];  PND [  ];  GI:  gallstones[  ], vomiting[  ];  dysphagia[  ]; melena[  ];  hematochezia [  ]; heartburn[  y];   GU: kidney stones [  ]; hematuria[  ];   dysuria [  ];  nocturia[  ]; incontinence [  ];             Skin: rash, swelling[  ];, hair loss[  ];  peripheral edema[  ];  or itching[  ]; Musculosketetal: myalgias[  ];  joint swelling[  ];  joint erythema[  ];  joint pain[  y];  back pain[  ];  Heme/Lymph: bruising[  ];   bleeding[  ];  anemia[  ];  Neuro: TIA[  ];  headaches[  ];  stroke[  ];  vertigo[  ];  seizures[  ];   paresthesias[  ];  difficulty walking[  ];  Psych:depression[  ]; anxiety[  ];  Endocrine: diabetes[ y ];  thyroid dysfunction[  ];  Other:  Past Medical History  Diagnosis Date  . Diabetes mellitus   . Hypertension   . CAD (coronary artery disease)   . S/P CABG (coronary artery bypass graft)   . COPD (chronic obstructive pulmonary disease)   .   GERD (gastroesophageal reflux disease)     Medications Prior to Admission  Medication Sig Dispense Refill  . albuterol (PROAIR HFA) 108 (90 BASE) MCG/ACT inhaler Inhale 2 puffs into the lungs every 6 (six) hours as needed for wheezing or shortness of breath.    . Alirocumab (PRALUENT) 75 MG/ML SOPN Inject 1 Applicatorful into the skin every 14 (fourteen) days.    . amLODipine (NORVASC) 5 MG tablet Take 5 mg by mouth daily.    . aspirin EC 325 MG tablet Take 325 mg by mouth at bedtime.    . cholecalciferol (VITAMIN D) 1000 UNITS tablet Take 2,000 Units by mouth daily.    . CINNAMON PO Take 2,000 mg by mouth daily.     . hydrochlorothiazide (HYDRODIURIL) 25 MG tablet Take 25 mg by mouth daily.    . Insulin Glargine (LANTUS SOLOSTAR) 100 UNIT/ML Solostar Pen Inject 25 Units into the skin at bedtime.    . lansoprazole (PREVACID) 15 MG capsule Take 15 mg by mouth daily at 12 noon.    . lisinopril (PRINIVIL,ZESTRIL) 20 MG tablet Take 20 mg by mouth daily.    . Multiple Vitamin (MULTIVITAMIN WITH MINERALS) TABS tablet Take 2 tablets by mouth daily.    . ranolazine (RANEXA) 500 MG 12 hr tablet Take 500 mg by mouth every 12 (twelve) hours.       . [START ON 09/15/2014] Alirocumab  1 Applicatorful Subcutaneous Q14 Days  . amLODipine  5 mg Oral Daily  . aspirin EC  325 mg Oral QHS  . cholecalciferol  2,000 Units Oral Daily  . enoxaparin (LOVENOX) injection  40 mg Subcutaneous Q24H  . insulin aspart  0-15 Units Subcutaneous TID WC  . insulin aspart   0-5 Units Subcutaneous QHS  . insulin glargine  15 Units Subcutaneous QHS  . lisinopril  20 mg Oral Daily  . multivitamin with minerals  2 tablet Oral Daily  . pantoprazole  20 mg Oral Daily  . ranolazine  500 mg Oral Q12H    Infusions:    Allergies  Allergen Reactions  . Hydrocodone Other (See Comments)    Hallucinations  . Metoprolol Other (See Comments)    Muscle weakness  . Norvasc [Amlodipine Besylate] Other (See Comments)    Heartburn  . Percodan [Oxycodone-Aspirin] Other (See Comments)    hallucination  . Statins Other (See Comments)    Muscles ache    History   Social History  . Marital Status: Married    Spouse Name: N/A  . Number of Children: N/A  . Years of Education: N/A   Occupational History  . Not on file.   Social History Main Topics  . Smoking status: Former Smoker    Quit date: 08/29/1988  . Smokeless tobacco: Not on file  . Alcohol Use: No  . Drug Use: No  . Sexual Activity: Not on file   Other Topics Concern  . Not on file   Social History Narrative    History reviewed. No pertinent family history.  PHYSICAL EXAM: Filed Vitals:   09/10/14 1406  BP: 121/53  Pulse: 69  Temp: 98.3 F (36.8 C)  Resp:      Intake/Output Summary (Last 24 hours) at 09/10/14 1504 Last data filed at 09/10/14 0926  Gross per 24 hour  Intake   1525 ml  Output   1050 ml  Net    475 ml    General:  Sitting in bed . No respiratory difficulty HEENT: normal Neck: supple. no JVD.   Carotids 2+ bilat; no bruits. No lymphadenopathy or thryomegaly appreciated. Cor: PMI nondisplaced. Regular rate & rhythm. 2/6 TR Lungs: clear Abdomen: obese soft, nontender, nondistended. No hepatosplenomegaly. No bruits or masses. Good bowel sounds. Extremities: no cyanosis, clubbing, rash, trace edema Neuro: alert & oriented x 3, cranial nerves grossly intact. moves all 4 extremities w/o difficulty. Flat affect.  ECG: NSR with TWI and nonspecific ST-T wave abnormalities.  v1-v4 new since 2014   Results for orders placed or performed during the hospital encounter of 09/09/14 (from the past 24 hour(s))  Basic metabolic panel     Status: Abnormal   Collection Time: 09/09/14 10:14 PM  Result Value Ref Range   Sodium 135 135 - 145 mmol/L   Potassium 4.0 3.5 - 5.1 mmol/L   Chloride 102 101 - 111 mmol/L   CO2 20 (L) 22 - 32 mmol/L   Glucose, Bld 347 (H) 65 - 99 mg/dL   BUN 15 6 - 20 mg/dL   Creatinine, Ser 0.89 0.44 - 1.00 mg/dL   Calcium 9.3 8.9 - 10.3 mg/dL   GFR calc non Af Amer >60 >60 mL/min   GFR calc Af Amer >60 >60 mL/min   Anion gap 13 5 - 15  CBC     Status: None   Collection Time: 09/09/14 10:14 PM  Result Value Ref Range   WBC 9.0 4.0 - 10.5 K/uL   RBC 4.74 3.87 - 5.11 MIL/uL   Hemoglobin 12.4 12.0 - 15.0 g/dL   HCT 37.4 36.0 - 46.0 %   MCV 78.9 78.0 - 100.0 fL   MCH 26.2 26.0 - 34.0 pg   MCHC 33.2 30.0 - 36.0 g/dL   RDW 14.8 11.5 - 15.5 %   Platelets 246 150 - 400 K/uL  Protime-INR - (order if Patient is taking Coumadin / Warfarin)     Status: None   Collection Time: 09/09/14 10:14 PM  Result Value Ref Range   Prothrombin Time 13.3 11.6 - 15.2 seconds   INR 0.99 0.00 - 1.49  Brain natriuretic peptide     Status: None   Collection Time: 09/09/14 10:14 PM  Result Value Ref Range   B Natriuretic Peptide 24.7 0.0 - 100.0 pg/mL  I-stat troponin, ED     Status: None   Collection Time: 09/09/14 10:25 PM  Result Value Ref Range   Troponin i, poc 0.04 0.00 - 0.08 ng/mL   Comment 3          Urinalysis, Routine w reflex microscopic (not at [facility])     Status: Abnormal   Collection Time: 09/09/14 11:20 PM  Result Value Ref Range   Color, Urine YELLOW YELLOW   APPearance CLOUDY (A) CLEAR   Specific Gravity, Urine 1.018 1.005 - 1.030   pH 6.0 5.0 - 8.0   Glucose, UA >1000 (A) NEGATIVE mg/dL   Hgb urine dipstick NEGATIVE NEGATIVE   Bilirubin Urine NEGATIVE NEGATIVE   Ketones, ur NEGATIVE NEGATIVE mg/dL   Protein, ur NEGATIVE NEGATIVE mg/dL     Urobilinogen, UA 0.2 0.0 - 1.0 mg/dL   Nitrite NEGATIVE NEGATIVE   Leukocytes, UA SMALL (A) NEGATIVE  Urine microscopic-add on     Status: None   Collection Time: 09/09/14 11:20 PM  Result Value Ref Range   Squamous Epithelial / LPF RARE RARE   WBC, UA 3-6 <3 WBC/hpf   RBC / HPF 0-2 <3 RBC/hpf   Bacteria, UA RARE RARE  Glucose, capillary     Status: Abnormal   Collection   Time: 09/10/14  1:41 AM  Result Value Ref Range   Glucose-Capillary 241 (H) 65 - 99 mg/dL  Troponin I-serum (0, 3, 6 hours)     Status: Abnormal   Collection Time: 09/10/14  4:00 AM  Result Value Ref Range   Troponin I 0.17 (H) <0.031 ng/mL  TSH     Status: None   Collection Time: 09/10/14  4:00 AM  Result Value Ref Range   TSH 1.550 0.350 - 4.500 uIU/mL  CBC     Status: Abnormal   Collection Time: 09/10/14  4:00 AM  Result Value Ref Range   WBC 9.3 4.0 - 10.5 K/uL   RBC 4.22 3.87 - 5.11 MIL/uL   Hemoglobin 11.1 (L) 12.0 - 15.0 g/dL   HCT 33.5 (L) 36.0 - 46.0 %   MCV 79.4 78.0 - 100.0 fL   MCH 26.3 26.0 - 34.0 pg   MCHC 33.1 30.0 - 36.0 g/dL   RDW 15.0 11.5 - 15.5 %   Platelets 210 150 - 400 K/uL  Creatinine, serum     Status: None   Collection Time: 09/10/14  4:00 AM  Result Value Ref Range   Creatinine, Ser 0.78 0.44 - 1.00 mg/dL   GFR calc non Af Amer >60 >60 mL/min   GFR calc Af Amer >60 >60 mL/min  Lipid panel     Status: Abnormal   Collection Time: 09/10/14  4:00 AM  Result Value Ref Range   Cholesterol 216 (H) 0 - 200 mg/dL   Triglycerides 124 <150 mg/dL   HDL 45 >40 mg/dL   Total CHOL/HDL Ratio 4.8 RATIO   VLDL 25 0 - 40 mg/dL   LDL Cholesterol 146 (H) 0 - 99 mg/dL  Glucose, capillary     Status: Abnormal   Collection Time: 09/10/14  7:57 AM  Result Value Ref Range   Glucose-Capillary 158 (H) 65 - 99 mg/dL  Troponin I-serum (0, 3, 6 hours)     Status: Abnormal   Collection Time: 09/10/14  8:45 AM  Result Value Ref Range   Troponin I 0.18 (H) <0.031 ng/mL  Glucose, capillary      Status: Abnormal   Collection Time: 09/10/14 11:41 AM  Result Value Ref Range   Glucose-Capillary 135 (H) 65 - 99 mg/dL   Dg Chest 2 View  09/09/2014   CLINICAL DATA:  Acute onset of left-sided chest pain and shortness of breath. Initial encounter.  EXAM: CHEST  2 VIEW  COMPARISON:  Chest radiograph performed 01/09/2013  FINDINGS: The lungs are well-aerated. Mild bibasilar airspace opacities may reflect atelectasis or mild pneumonia. There is no evidence of pleural effusion or pneumothorax.  The heart is borderline normal in size. The patient is status post median sternotomy. No acute osseous abnormalities are seen. The patient is status post left-sided rotator cuff repair.  IMPRESSION: Mild bibasilar airspace opacities may reflect atelectasis or mild pneumonia.   Electronically Signed   By: Jeffery  Chang M.D.   On: 09/09/2014 23:04     ASSESSMENT: 1. Chest pain with minimally elevated troponin  2. CAD s/p CABG 2015 3. DM2 4. HL  5. Chronic CP 6. Intolerance to b-blockers 7. Obesity   PLAN/DISCUSSION:  It is a bit difficult to sort through ms. Sokol's symptoms. She has had chronic CP which has not gotten much better with CABG. Her most recent episode seems a bit more severe than her typical symptoms. ECG shows new TWI anteriorly but we do not have any ECGs done after her   CABG to compare. I have suggested that she consider cardiac cath but she would prefer to start with a Myoview. Given that troponin is only minimally elevated and she is now pain free, I am quite comfortable with that approach. Continue ASA, NTG, statin. Would start heparin if CP recurs. She is intolerant of b-blockers.  Bensimhon, Daniel,MD 3:13 PM      

## 2014-09-12 NOTE — Progress Notes (Signed)
ANTICOAGULATION CONSULT NOTE - Initial Consult  Pharmacy Consult for Heparin Indication: chest pain/ACS  Allergies  Allergen Reactions  . Hydrocodone Other (See Comments)    Hallucinations  . Metoprolol Other (See Comments)    Muscle weakness  . Norvasc [Amlodipine Besylate] Other (See Comments)    Heartburn  . Percodan [Oxycodone-Aspirin] Other (See Comments)    hallucination  . Statins Other (See Comments)    Muscles ache    Patient Measurements: Height: 5' (152.4 cm) Weight: 159 lb 6.3 oz (72.3 kg) IBW/kg (Calculated) : 45.5 Heparin Dosing Weight: 60 kg  Vital Signs: Temp: 98.8 F (37.1 C) (07/25 1959) Temp Source: Oral (07/25 1959) BP: 141/69 mmHg (07/26 0019) Pulse Rate: 91 (07/26 0019)  Labs:  Recent Labs  09/09/14 2214 09/10/14 0400 09/10/14 0845  HGB 12.4 11.1*  --   HCT 37.4 33.5*  --   PLT 246 210  --   LABPROT 13.3  --   --   INR 0.99  --   --   CREATININE 0.89 0.78  --   TROPONINI  --  0.17* 0.18*    Estimated Creatinine Clearance: 63 mL/min (by C-G formula based on Cr of 0.78).   Medical History: Past Medical History  Diagnosis Date  . Diabetes mellitus   . Hypertension   . CAD (coronary artery disease)   . S/P CABG (coronary artery bypass graft)   . COPD (chronic obstructive pulmonary disease)   . GERD (gastroesophageal reflux disease)     Medications:  Scheduled:  . [START ON 09/15/2014] Alirocumab  1 Applicatorful Subcutaneous Q14 Days  . amLODipine  10 mg Oral Daily  . aspirin  81 mg Oral Pre-Cath  . aspirin EC  325 mg Oral QHS  . cholecalciferol  2,000 Units Oral Daily  . insulin aspart  0-15 Units Subcutaneous TID WC  . insulin aspart  0-5 Units Subcutaneous QHS  . insulin glargine  15 Units Subcutaneous QHS  . lisinopril  20 mg Oral Daily  . multivitamin with minerals  2 tablet Oral Daily  . pantoprazole  20 mg Oral Daily  . ranolazine  500 mg Oral Q12H  . sodium chloride  3 mL Intravenous Q12H    Assessment: 64 y.o.  female with chest pain, abnormal stress test, for heparin  Goal of Therapy:  Heparin level 0.3-0.7 units/ml Monitor platelets by anticoagulation protocol: Yes   Plan:  D/C Lovenox Heparin 3000 units IV bolus, then start heparin 850 units/hr Check heparin level in 6 hours.   Debbie [name], Gary Fleet 09/12/2014,12:42 AM

## 2014-09-12 NOTE — Progress Notes (Signed)
Pt called out with complaints of 10/10 CP that she described as a crushing type of pain and some SOB. Gave pt one nitro SL and did an EKG. Pain was relieved with the one nitro. 2 L O2 was applied for support. Paged on call physician and received orders to start heparin gtt. Pt is resting. Will continue to monitor.   Tera Helper E

## 2014-09-12 NOTE — Progress Notes (Signed)
ANTICOAGULATION CONSULT NOTE - Initial Consult  Pharmacy Consult for heparin Indication: chest pain/ACS  Allergies  Allergen Reactions  . Hydrocodone Other (See Comments)    Hallucinations  . Metoprolol Other (See Comments)    Muscle weakness  . Norvasc [Amlodipine Besylate] Other (See Comments)    Heartburn  . Percodan [Oxycodone-Aspirin] Other (See Comments)    hallucination  . Statins Other (See Comments)    Muscles ache    Patient Measurements: Height: 5' (152.4 cm) Weight: 158 lb 8 oz (71.895 kg) IBW/kg (Calculated) : 45.5 Heparin Dosing Weight: 61 kg  Vital Signs: Temp: 97.8 F (36.6 C) (07/26 0558) Temp Source: Oral (07/26 0558) BP: 112/63 mmHg (07/26 0849) Pulse Rate: 137 (07/26 0859)  Labs:  Recent Labs  09/09/14 2214 09/10/14 0400 09/10/14 0845 09/12/14 0428 09/12/14 0714  HGB 12.4 11.1*  --  11.1*  --   HCT 37.4 33.5*  --  33.5*  --   PLT 246 210  --  209  --   LABPROT 13.3  --   --  14.5  --   INR 0.99  --   --  1.11  --   HEPARINUNFRC  --   --   --   --  0.55  CREATININE 0.89 0.78  --  0.74  --   TROPONINI  --  0.17* 0.18*  --   --     Estimated Creatinine Clearance: 62.9 mL/min (by C-G formula based on Cr of 0.74).   Medical History: Past Medical History  Diagnosis Date  . Diabetes mellitus   . Hypertension   . CAD (coronary artery disease)   . S/P CABG (coronary artery bypass graft)   . COPD (chronic obstructive pulmonary disease)   . GERD (gastroesophageal reflux disease)    Assessment: 64yoF admitted 09/09/2014 with chest pain (has chronic chest pain, that did not get much better after CABG in 2015), abnormal stress test, and cardiac cath on 7/26. Pharmacy to restart heparin for ACS, 8hr post sheath removal (start at 1700). Prior to cath, heparin was at 850 units/hr with therapeutic HL (0.55). H/H 11.1/33.5, Plt 209, No bleeding noted.   Goal of Therapy:  Heparin level 0.3-0.7 units/ml Monitor platelets by anticoagulation protocol:  Yes   Plan:  - Restart heparin 850 units/hr at 1700 - 2300 HL - Daily HL/CBC, Monitor s/sx of bleeding - F/u progress of TR band removal  Casilda Carls, PharmD. Clinical Pharmacist Resident Pager: [phone]

## 2014-09-13 ENCOUNTER — Encounter (HOSPITAL_COMMUNITY): Payer: Self-pay | Admitting: Interventional Cardiology

## 2014-09-13 ENCOUNTER — Encounter (HOSPITAL_COMMUNITY)
Admission: EM | Disposition: A | Discharge status: Home / Self Care | Payer: Self-pay | Source: Home / Self Care | Attending: Internal Medicine

## 2014-09-13 DIAGNOSIS — I25710 Atherosclerosis of autologous vein coronary artery bypass graft(s) with unstable angina pectoris: Principal | ICD-10-CM

## 2014-09-13 DIAGNOSIS — E1159 Type 2 diabetes mellitus with other circulatory complications: Secondary | ICD-10-CM

## 2014-09-13 HISTORY — PX: CARDIAC CATHETERIZATION: SHX172

## 2014-09-13 LAB — HEPARIN LEVEL (UNFRACTIONATED)
Heparin Unfractionated: 0.28 IU/mL — ABNORMAL LOW (ref 0.30–0.70)
Heparin Unfractionated: 0.62 IU/mL (ref 0.30–0.70)

## 2014-09-13 LAB — CBC
HCT: 32 % — ABNORMAL LOW (ref 36.0–46.0)
HEMOGLOBIN: 10.6 g/dL — AB (ref 12.0–15.0)
MCH: 26.6 pg (ref 26.0–34.0)
MCHC: 33.1 g/dL (ref 30.0–36.0)
MCV: 80.4 fL (ref 78.0–100.0)
Platelets: 188 10*3/uL (ref 150–400)
RBC: 3.98 MIL/uL (ref 3.87–5.11)
RDW: 15 % (ref 11.5–15.5)
WBC: 7.2 10*3/uL (ref 4.0–10.5)

## 2014-09-13 LAB — GLUCOSE, CAPILLARY
GLUCOSE-CAPILLARY: 189 mg/dL — AB (ref 65–99)
Glucose-Capillary: 198 mg/dL — ABNORMAL HIGH (ref 65–99)
Glucose-Capillary: 109 mg/dL — ABNORMAL HIGH (ref 65–99)
Glucose-Capillary: 103 mg/dL — ABNORMAL HIGH (ref 65–99)

## 2014-09-13 LAB — BASIC METABOLIC PANEL
ANION GAP: 6 (ref 5–15)
BUN: 15 mg/dL (ref 6–20)
CHLORIDE: 102 mmol/L (ref 101–111)
CO2: 28 mmol/L (ref 22–32)
Calcium: 9.2 mg/dL (ref 8.9–10.3)
Creatinine, Ser: 0.7 mg/dL (ref 0.44–1.00)
GFR calc Af Amer: >60 mL/min (ref >60)
GFR calc non Af Amer: >60 mL/min (ref >60)
GLUCOSE: 163 mg/dL — AB (ref 65–99)
POTASSIUM: 4.4 mmol/L (ref 3.5–5.1)
Sodium: 136 mmol/L (ref 135–145)

## 2014-09-13 LAB — POCT ACTIVATED CLOTTING TIME: Activated Clotting Time: 724 seconds

## 2014-09-13 SURGERY — CORONARY STENT INTERVENTION

## 2014-09-13 MED ORDER — BIVALIRUDIN BOLUS VIA INFUSION
0.1000 mg/kg | Freq: Once | INTRAVENOUS | Status: DC
  Filled 2014-09-13: qty 8

## 2014-09-13 MED ORDER — FENTANYL CITRATE (PF) 100 MCG/2ML IJ SOLN
INTRAMUSCULAR | Status: DC | PRN
  Administered 2014-09-13 (×2): 25 ug via INTRAVENOUS

## 2014-09-13 MED ORDER — SODIUM CHLORIDE 0.9 % IJ SOLN: 3.0000 mL | INTRAMUSCULAR | Status: DC | PRN

## 2014-09-13 MED ORDER — SODIUM CHLORIDE 0.9 % IV SOLN: INTRAVENOUS | Status: DC

## 2014-09-13 MED ORDER — FENTANYL CITRATE (PF) 100 MCG/2ML IJ SOLN
INTRAMUSCULAR | Status: AC
  Filled 2014-09-13: qty 2

## 2014-09-13 MED ORDER — SODIUM CHLORIDE 0.9 % IV SOLN: 250.0000 mL | INTRAVENOUS | Status: DC | PRN

## 2014-09-13 MED ORDER — NIACIN ER 250 MG PO CPCR
250.0000 mg | ORAL_CAPSULE | Freq: Every day | ORAL | Status: DC
  Administered 2014-09-13: 22:00:00 250 mg via ORAL
  Filled 2014-09-13 (×3): qty 1

## 2014-09-13 MED ORDER — NITROGLYCERIN 1 MG/10 ML FOR IR/CATH LAB
INTRA_ARTERIAL | Status: AC
  Filled 2014-09-13: qty 10

## 2014-09-13 MED ORDER — CLOPIDOGREL BISULFATE 75 MG PO TABS
75.0000 mg | ORAL_TABLET | Freq: Every day | ORAL | Status: DC
  Administered 2014-09-14: 10:00:00 75 mg via ORAL
  Filled 2014-09-13: qty 1

## 2014-09-13 MED ORDER — SODIUM CHLORIDE 0.9 % IJ SOLN
3.0000 mL | Freq: Two times a day (BID) | INTRAMUSCULAR | Status: DC
  Administered 2014-09-13: 3 mL via INTRAVENOUS

## 2014-09-13 MED ORDER — SODIUM CHLORIDE 0.9 % IV SOLN
0.2500 mg/kg/h | INTRAVENOUS | Status: DC
  Filled 2014-09-13: qty 250

## 2014-09-13 MED ORDER — INSULIN GLARGINE 100 UNIT/ML ~~LOC~~ SOLN
15.0000 [IU] | Freq: Two times a day (BID) | SUBCUTANEOUS | Status: DC
  Administered 2014-09-13 – 2014-09-14 (×2): 15 [IU] via SUBCUTANEOUS
  Filled 2014-09-13 (×3): qty 0.15

## 2014-09-13 MED ORDER — SODIUM CHLORIDE 0.9 % IJ SOLN: 3.0000 mL | Freq: Two times a day (BID) | INTRAMUSCULAR | Status: DC

## 2014-09-13 MED ORDER — SODIUM CHLORIDE 0.9 % IV SOLN
250.0000 mg | INTRAVENOUS | Status: DC | PRN
  Administered 2014-09-13: 1.75 mg/kg/h via INTRAVENOUS

## 2014-09-13 MED ORDER — CLOPIDOGREL BISULFATE 300 MG PO TABS
ORAL_TABLET | ORAL | Status: DC | PRN
  Administered 2014-09-13: 600 mg via ORAL

## 2014-09-13 MED ORDER — ASPIRIN 81 MG PO CHEW
81.0000 mg | CHEWABLE_TABLET | ORAL | Status: AC
  Administered 2014-09-13: 81 mg via ORAL
  Filled 2014-09-13: qty 1

## 2014-09-13 MED ORDER — CLOPIDOGREL BISULFATE 300 MG PO TABS
ORAL_TABLET | ORAL | Status: AC
  Filled 2014-09-13: qty 2

## 2014-09-13 MED ORDER — IOHEXOL 350 MG/ML SOLN
INTRAVENOUS | Status: DC | PRN
  Administered 2014-09-13: 110 mL via INTRAVENOUS

## 2014-09-13 MED ORDER — LIDOCAINE HCL (PF) 1 % IJ SOLN
INTRAMUSCULAR | Status: AC
  Filled 2014-09-13: qty 30

## 2014-09-13 MED ORDER — MIDAZOLAM HCL 2 MG/2ML IJ SOLN
INTRAMUSCULAR | Status: AC
  Filled 2014-09-13: qty 2

## 2014-09-13 MED ORDER — SODIUM CHLORIDE 0.9 % IV SOLN
INTRAVENOUS | Status: DC
Start: 2014-09-13 — End: 2014-09-14

## 2014-09-13 MED ORDER — NITROGLYCERIN 1 MG/10 ML FOR IR/CATH LAB
INTRA_ARTERIAL | Status: DC | PRN
  Administered 2014-09-13: 18:00:00

## 2014-09-13 MED ORDER — BIVALIRUDIN BOLUS VIA INFUSION - CUPID
INTRAVENOUS | Status: DC | PRN
  Administered 2014-09-13: 53.775 mg via INTRAVENOUS

## 2014-09-13 MED ORDER — MIDAZOLAM HCL 2 MG/2ML IJ SOLN
INTRAMUSCULAR | Status: DC | PRN
  Administered 2014-09-13 (×2): 1 mg via INTRAVENOUS

## 2014-09-13 MED ORDER — NITROGLYCERIN 2 % TD OINT
1.0000 [in_us] | TOPICAL_OINTMENT | Freq: Four times a day (QID) | TRANSDERMAL | Status: DC
  Administered 2014-09-13: 1 [in_us] via TOPICAL
  Filled 2014-09-13: qty 30

## 2014-09-13 SURGICAL SUPPLY — 16 items
BALLN MINITREK RX 2.0X12 (BALLOONS) ×2
BALLN ~~LOC~~ EUPHORA RX 2.5X15 (BALLOONS) ×2
BALLOON MINITREK RX 2.0X12 (BALLOONS) ×1 IMPLANT
BALLOON ~~LOC~~ EUPHORA RX 2.5X15 (BALLOONS) ×1 IMPLANT
CATH VISTA GUIDE 6FR LCB (CATHETERS) ×2 IMPLANT
GUIDE CATH RUNWAY 6FR FR4 (CATHETERS) ×2 IMPLANT
KIT ENCORE 26 ADVANTAGE (KITS) ×2 IMPLANT
KIT HEART LEFT (KITS) ×2 IMPLANT
PACK CARDIAC CATHETERIZATION (CUSTOM PROCEDURE TRAY) ×2 IMPLANT
SHEATH PINNACLE 6F 10CM (SHEATH) ×2 IMPLANT
STENT RESOLUTE INTEG 2.25X18 (Permanent Stent) ×2 IMPLANT
STENT RESOLUTE INTEG 2.5X8 (Permanent Stent) ×2 IMPLANT
TRANSDUCER W/STOPCOCK (MISCELLANEOUS) ×2 IMPLANT
TUBING CIL FLEX 10 FLL-RA (TUBING) ×2 IMPLANT
WIRE COUGAR XT STRL 190CM (WIRE) ×2 IMPLANT
WIRE EMERALD 3MM-J .035X150CM (WIRE) ×2 IMPLANT

## 2014-09-13 NOTE — Progress Notes (Signed)
ANTICOAGULATION CONSULT NOTE Pharmacy Consult for Heparin Indication: chest pain/ACS  Allergies  Allergen Reactions  . Hydrocodone Other (See Comments)    Hallucinations  . Metoprolol Other (See Comments)    Muscle weakness  . Norvasc [Amlodipine Besylate] Other (See Comments)    Heartburn  . Percodan [Oxycodone-Aspirin] Other (See Comments)    hallucination  . Statins Other (See Comments)    Muscles ache    Patient Measurements: Height: 5' (152.4 cm) Weight: 158 lb 1.1 oz (71.7 kg) IBW/kg (Calculated) : 45.5 Heparin Dosing Weight: 60 kg  Vital Signs: Temp: 97.9 F (36.6 C) (07/27 0523) Temp Source: Oral (07/27 0523) BP: 127/61 mmHg (07/27 0623)  Labs:  Recent Labs  09/12/14 0428 09/12/14 0714 09/13/14 0015 09/13/14 0826  HGB 11.1*  --  10.6*  --   HCT 33.5*  --  32.0*  --   PLT 209  --  188  --   LABPROT 14.5  --   --   --   INR 1.11  --   --   --   HEPARINUNFRC  --  0.55 0.28* 0.62  CREATININE 0.74  --   --   --     Estimated Creatinine Clearance: 62.8 mL/min (by C-G formula based on Cr of 0.74).  Assessment: 64 y.o. female with multivessel CAD s/p cath, awaiting PCI, for heparin  HL 0.62, H/H and plts trend down. No bleeding noted.  Goal of Therapy:  Heparin level 0.3-0.7 units/ml Monitor platelets by anticoagulation protocol: Yes   Plan:  Continue Heparin 1000 units/hr Follow-up am HL, CBC, s/sx of bleeding  Greggory Stallion, PharmD Clinical Pharmacist Pager # [phone] 09/13/2014 10:29 AM

## 2014-09-13 NOTE — Care Management Note (Signed)
Case Management Note  Patient Details  Name: Debbie [name] MRN: [id number] Date of Birth: 1951-01-19  Subjective/Objective:   Pt admitted for chest pain. Post cardiac cath and PCI 09-13-14.                  Action/Plan: No needs identified by CM at this time. Will continue to monitor.   Expected Discharge Date:  09/11/14               Expected Discharge Plan:  Home/Self Care  In-House Referral:     Discharge planning Services  CM Consult  Post Acute Care Choice:  NA Choice offered to:  NA  DME Arranged:  N/A DME Agency:  NA  HH Arranged:  NA HH Agency:  NA  Status of Service:  In process, will continue to follow  Medicare Important Message Given:    Date Medicare IM Given:    Medicare IM give by:    Date Additional Medicare IM Given:    Additional Medicare Important Message give by:     If discussed at Long Length of Stay Meetings, dates discussed:    Additional Comments:  Gala Lewandowsky, RN 09/13/2014, 1:53 PM

## 2014-09-13 NOTE — Progress Notes (Signed)
Site area: right groin a 6 french arterial sheath was removed  Site Prior to Removal:  Level 0  Pressure Applied For 20 MINUTES    Minutes Beginning at 1910p  Manual:   Yes.    Patient Status During Pull:  stable  Post Pull Groin Site:  Level 0  Post Pull Instructions Given:  Yes.    Post Pull Pulses Present:  Yes.    Dressing Applied:  Yes.    Comments:  VS remain stable during sheath pull.  Pt denies any discomfort at site at this time

## 2014-09-13 NOTE — Progress Notes (Signed)
Client pulse went into the 140's, blood pressure was 146/120, chest pain was 5 out of 10, breathing was tachypnea, and anxious. Two sublingual nitro's were administered, 2 liters of oxygen nasal cannula was applied,  of morphine was administered, ECG was taken and showed new ST depression, Dr. Tresa Endo was notified. Treatment was effective and client is now pain free and the doctor is aware. Will continue to monitor for changes.

## 2014-09-13 NOTE — Interval H&P Note (Signed)
History and Physical Interval Note:  09/13/2014 4:13 PM  Debbie [name]  has presented today for cardiac cath/PCI with the diagnosis of CAD/unstable angina. The various methods of treatment have been discussed with the patient and family. After consideration of risks, benefits and other options for treatment, the patient has consented to  Procedure(s): Coronary Stent Intervention (N/A) as a surgical intervention .  The patient's history has been reviewed, patient examined, no change in status, stable for surgery.  I have reviewed the patient's chart and labs.  Questions were answered to the patient's satisfaction.    Cath Lab Visit (complete for each Cath Lab visit)  Clinical Evaluation Leading to the Procedure:   ACS: Yes.    Non-ACS:    Anginal Classification: CCS III  Anti-ischemic medical therapy: Maximal Therapy (2 or more classes of medications)  Non-Invasive Test Results: No non-invasive testing performed  Prior CABG: Previous CABG         MCALHANY,CHRISTOPHER

## 2014-09-13 NOTE — H&P (View-Only) (Signed)
   Patient Name: Debbie [name] Date of Encounter: 09/13/2014  Principal Problem:   Unstable angina Active Problems:   DM2 (diabetes mellitus, type 2)   S/P CABG x 4   COPD (chronic obstructive pulmonary disease)   Benign essential HTN   GERD (gastroesophageal reflux disease)   Dyslipidemia   Elevated troponin   Chest pain   Primary Cardiologist: Dr Callwood in 2014  Patient Profile: 64yo woman with PMH of DM2, HLD, CAD s/p CABG 9/ 2015, COPD, HTN, GERD, admitted 07/24 w/ chest pain, cath w/ multi-vessel dz, TCTS saw and rec multi-vessel PCI/stent. Scheduled for 3 pm today.  SUBJECTIVE: Had chest pain once during the night, rx w/ SL NTG x 2 and morphine. Currently pain-free. Started at rest.  OBJECTIVE Filed Vitals:   09/13/14 0450 09/13/14 0459 09/13/14 0523 09/13/14 0623  BP: 99/43 99/52 102/49 127/61  Pulse:      Temp:   97.9 F (36.6 C)   TempSrc:   Oral   Resp: 17  17 16  Height:      Weight:   158 lb 1.1 oz (71.7 kg)   SpO2: 97% 98% 98% 97%    Intake/Output Summary (Last 24 hours) at 09/13/14 0935 Last data filed at 09/12/14 2100  Gross per 24 hour  Intake 431.96 ml  Output   1550 ml  Net -1118.04 ml   Filed Weights   09/10/14 0140 09/12/14 0558 09/13/14 0523  Weight: 159 lb 6.3 oz (72.3 kg) 158 lb 8 oz (71.895 kg) 158 lb 1.1 oz (71.7 kg)    PHYSICAL EXAM General: Well developed, well nourished, female in no acute distress. Head: Normocephalic, atraumatic.  Neck: Supple without bruits, JVD not elevated. Lungs:  Resp regular and unlabored, CTA. Heart: RRR, S1, S2, no S3, S4, or murmur; no rub. Abdomen: Soft, non-tender, non-distended, BS + x 4.  Extremities: No clubbing, cyanosis, mild edema RUE (IV infiltrated).  Neuro: Alert and oriented X 3. Moves all extremities spontaneously. Psych: Normal affect.  LABS: CBC: Recent Labs  09/12/14 0428 09/13/14 0015  WBC 9.5 7.2  HGB 11.1* 10.6*  HCT 33.5* 32.0*  MCV 80.7 80.4  PLT 209 188    INR: Recent Labs  09/12/14 0428  INR 1.11   Basic Metabolic Panel: Recent Labs  09/12/14 0428  NA 137  K 4.3  CL 103  CO2 26  GLUCOSE 281*  BUN 14  CREATININE 0.74  CALCIUM 8.7*   BNP:  B NATRIURETIC PEPTIDE  Date/Time Value Ref Range Status  09/09/2014 10:14 PM 24.7 0.0 - 100.0 pg/mL Final    TELE: SR, no sig ectopy    Radiology/Studies: Nm Myocar Multi W/spect W/wall Motion / Ef  09/11/2014   CLINICAL DATA:  Chest pain. CABG. Diabetes. Hypertension. Coronary artery disease. Shortness of breath.  EXAM: MYOCARDIAL IMAGING WITH SPECT (REST AND EXERCISE)  GATED LEFT VENTRICULAR WALL MOTION STUDY  LEFT VENTRICULAR EJECTION FRACTION  TECHNIQUE: Standard myocardial SPECT imaging was performed after resting intravenous injection of 10 mCi Tc-99m sestamibi. Subsequently, exercise tolerance test was performed by the patient under the supervision of the Cardiology staff. At peak-stress, 30 mCi Tc-99m sestamibi was injected intravenously and standard myocardial SPECT imaging was performed. Quantitative gated imaging was also performed to evaluate left ventricular wall motion, and estimate left ventricular ejection fraction.  COMPARISON:  01/10/2013  FINDINGS: Perfusion: Moderate sized moderate severity lateral wall inducible ischemia defect on stress images.  Wall Motion: Akinetic septum with associated poor wall thickening.    Left Ventricular Ejection Fraction: 48 %  End diastolic volume 81 ml  End systolic volume 43 ml  IMPRESSION: 1. Moderate-sized moderate severity lateral wall perfusion defect on stress images compatible with inducible ischemia.  .  2. A kinetics septum with associated poor wall thickening.  3. Left ventricular ejection fraction forty-eight%  4. Intermediate-risk stress test findings*.  *2012 Appropriate Use Criteria for Coronary Revascularization Focused Update: J Am Coll Cardiol. 2012;59(9):857-881. http://content.onlinejacc.org/article.aspx?articleid=1201161    Electronically Signed   By: Walter  Liebkemann M.D.   On: 09/11/2014 13:26     Current Medications:  . [START ON 09/15/2014] Alirocumab  1 Applicatorful Subcutaneous Q14 Days  . amLODipine  10 mg Oral Daily  . aspirin EC  325 mg Oral QHS  . cholecalciferol  2,000 Units Oral Daily  . insulin aspart  0-15 Units Subcutaneous TID WC  . insulin aspart  0-5 Units Subcutaneous QHS  . insulin glargine  10 Units Subcutaneous BID  . lisinopril  20 mg Oral Daily  . multivitamin with minerals  2 tablet Oral Daily  . pantoprazole  20 mg Oral Daily  . ranolazine  500 mg Oral Q12H  . sodium chloride  3 mL Intravenous Q12H   . heparin 1,000 Units/hr (09/13/14 0230)   TCTS Consult: 09/12/2014 Ultimately the patient may require redo coronary artery bypass grafting in the future, but at the present time I would favor a strategy using multivessel PCI and stenting. This could include PCI and stenting of the short segment high-grade proximal stenosis of the large vein graft to the obtuse marginal branch and PCI and stenting of the native right coronary artery. PCI and stenting of the left main coronary artery could also be considered given continued patency of the left internal mammary artery graft to the distal left anterior descending coronary artery, although this may not be necessary.  I have discussed matters at length with the patient and her family this afternoon. Alternative treatment strategies of been discussed. The importance of long-term risk factor modification on the long-term impact of her underlying disease process has been stressed. All of her questions have been addressed. Please call if we can be of further assistance.  ASSESSMENT AND PLAN: Principal Problem:   Unstable angina - see TCTS note above, for PCI today - continue heparin, add NTG paste - continue ASA, lisinopril - pt likes Dr Smith, wants f/u with [facility], lives in [place], so not sure if she wants f/u in [place] or [place] -  for Cholesterol, is on Praluent every other Friday, rx from [facility] in [place], she may want to change to us.  Otherwise, per IM Active Problems:   DM2 (diabetes mellitus, type 2)   S/P CABG x 4   COPD (chronic obstructive pulmonary disease)   Benign essential HTN   GERD (gastroesophageal reflux disease)   Dyslipidemia - on Praluent   Elevated troponin   Chest pain   Signed, Barrett, Rhonda , PA-C 9:35 AM 09/13/2014   I have examined the patient and reviewed assessment and plan and discussed with patient.  Agree with above as stated.  For PCI later today: Likely native RCA and SVG to OM.  D/w Dr. Smith.  Patient with CP last night.  Dakisha Schoof S.  

## 2014-09-13 NOTE — Progress Notes (Signed)
[facility] PROGRESS NOTE  Assessment/Plan: Unstable Angina: - EKG showed flipped T waves on the lateral leads, one set of troponins elevated. - For stress test on 7.25.2016 showed sign of ischemia.Cardiology recommended cardiac cath on 7 26,016 see cardiology note - Does not tolerate beta blockers check hemoglobin A1c. - Further management per cardiology and CT surgery. - Question if she will benefit from Niaspan.  DM2 (diabetes mellitus, type 2) - Hemoglobin A1c was 8.7. - Continue to titrate insulins as needed.  S/P CABG (coronary artery bypass graft) - Done over at [facility] we'll try to get records.  Benign essential HTN: - Cont to be elevated, increase norvasc.  GERD (gastroesophageal reflux disease) PPI    Code Status: full Family Communication: daughter  Disposition Plan: inpatient   Consultants:  cardiology  Procedures:  [facility] 7.26.2016:   Antibiotics: none HPI/Subjective:  some chest pain overnight resolved with nitroglycerin Objective: Filed Vitals:   09/13/14 0450 09/13/14 0459 09/13/14 0523 09/13/14 0623  BP: 99/43 99/52 102/49 127/61  Pulse:      Temp:   97.9 F (36.6 C)   TempSrc:   Oral   Resp: 17  17 16   Height:      Weight:   71.7 kg (158 lb 1.1 oz)   SpO2: 97% 98% 98% 97%    Intake/Output Summary (Last 24 hours) at 09/13/14 1107 Last data filed at 09/13/14 1051  Gross per 24 hour  Intake 191.96 ml  Output   2050 ml  Net -1858.04 ml   Filed Weights   09/10/14 0140 09/12/14 0558 09/13/14 0523  Weight: 72.3 kg (159 lb 6.3 oz) 71.895 kg (158 lb 8 oz) 71.7 kg (158 lb 1.1 oz)    Exam:  General: Alert, awake, oriented x3, in no acute distress.  HEENT: No bruits, no goiter.  Heart: Regular rate and rhythm. Lungs: Good air movement,clear Abdomen: Soft, nontender, nondistended, positive bowel sounds.  Neuro: Grossly intact, nonfocal.   Data Reviewed: Basic Metabolic Panel:  Recent Labs Lab 09/09/14 2214  09/10/14 0400 09/12/14 0428  NA 135  --  137  K 4.0  --  4.3  CL 102  --  103  CO2 20*  --  26  GLUCOSE 347*  --  281*  BUN 15  --  14  CREATININE 0.89 0.78 0.74  CALCIUM 9.3  --  8.7*   Liver Function Tests: No results for input(s): AST, ALT, ALKPHOS, BILITOT, PROT, ALBUMIN in the last 168 hours. No results for input(s): LIPASE, AMYLASE in the last 168 hours. No results for input(s): AMMONIA in the last 168 hours. CBC:  Recent Labs Lab 09/09/14 2214 09/10/14 0400 09/12/14 0428 09/13/14 0015  WBC 9.0 9.3 9.5 7.2  HGB 12.4 11.1* 11.1* 10.6*  HCT 37.4 33.5* 33.5* 32.0*  MCV 78.9 79.4 80.7 80.4  PLT 246 210 209 188   Cardiac Enzymes:  Recent Labs Lab 09/10/14 0400 09/10/14 0845  TROPONINI 0.17* 0.18*   BNP (last 3 results)  Recent Labs  09/09/14 2214  BNP 24.7    ProBNP (last 3 results) No results for input(s): PROBNP in the last 8760 hours.  CBG:  Recent Labs Lab 09/12/14 0914 09/12/14 1059 09/12/14 1631 09/12/14 2109 09/13/14 0801  GLUCAP 172* 223* 212* 166* 198*    No results found for this or any previous visit (from the past 240 hour(s)).   Studies: Nm Myocar Multi W/spect W/wall Motion / Ef  09/11/2014   CLINICAL DATA:  Chest pain. CABG.  Diabetes. Hypertension. Coronary artery disease. Shortness of breath.  EXAM: MYOCARDIAL IMAGING WITH SPECT (REST AND EXERCISE)  GATED LEFT VENTRICULAR WALL MOTION STUDY  LEFT VENTRICULAR EJECTION FRACTION  TECHNIQUE: Standard myocardial SPECT imaging was performed after resting intravenous injection of 10 mCi Tc-31m sestamibi. Subsequently, exercise tolerance test was performed by the patient under the supervision of the Cardiology staff. At peak-stress, 30 mCi Tc-53m sestamibi was injected intravenously and standard myocardial SPECT imaging was performed. Quantitative gated imaging was also performed to evaluate left ventricular wall motion, and estimate left ventricular ejection fraction.  COMPARISON:  01/10/2013   FINDINGS: Perfusion: Moderate sized moderate severity lateral wall inducible ischemia defect on stress images.  Wall Motion: Akinetic septum with associated poor wall thickening.  Left Ventricular Ejection Fraction: 48 %  End diastolic volume 81 ml  End systolic volume 43 ml  IMPRESSION: 1. Moderate-sized moderate severity lateral wall perfusion defect on stress images compatible with inducible ischemia.  .  2. A kinetics septum with associated poor wall thickening.  3. Left ventricular ejection fraction forty-eight%  4. Intermediate-risk stress test findings*.  *2012 Appropriate Use Criteria for Coronary Revascularization Focused Update: J Am Coll Cardiol. 2012;59(9):857-881. http://content.dementiazones.com.aspx?articleid=1201161   Electronically Signed   By: Gaylyn Rong M.D.   On: 09/11/2014 13:26    Scheduled Meds: . [START ON 09/15/2014] Alirocumab  1 Applicatorful Subcutaneous Q14 Days  . amLODipine  10 mg Oral Daily  . aspirin EC  325 mg Oral QHS  . cholecalciferol  2,000 Units Oral Daily  . insulin aspart  0-15 Units Subcutaneous TID WC  . insulin aspart  0-5 Units Subcutaneous QHS  . insulin glargine  10 Units Subcutaneous BID  . lisinopril  20 mg Oral Daily  . multivitamin with minerals  2 tablet Oral Daily  . nitroGLYCERIN  1 inch Topical 4 times per day  . pantoprazole  20 mg Oral Daily  . ranolazine  500 mg Oral Q12H  . sodium chloride  3 mL Intravenous Q12H   Continuous Infusions: . heparin 1,000 Units/hr (09/13/14 0230)    Time Spent: 25 min   FELIZ Rosine Beat  [facility] Pager [phone]. If 7PM-7AM, please contact night-coverage at [email or web address], password [email or web address] 09/13/2014, 11:07 AM  LOS: 1 day

## 2014-09-13 NOTE — Progress Notes (Signed)
Patient Name: Debbie [name] Date of Encounter: 09/13/2014  Principal Problem:   Unstable angina Active Problems:   DM2 (diabetes mellitus, type 2)   S/P CABG x 4   COPD (chronic obstructive pulmonary disease)   Benign essential HTN   GERD (gastroesophageal reflux disease)   Dyslipidemia   Elevated troponin   Chest pain   Primary Cardiologist: Dr Juliann Pares in 2014  Patient Profile: 64yo woman with PMH of DM2, HLD, CAD s/p CABG 9/ 2015, COPD, HTN, GERD, admitted 07/24 w/ chest pain, cath w/ multi-vessel dz, TCTS saw and rec multi-vessel PCI/stent. Scheduled for 3 pm today.  SUBJECTIVE: Had chest pain once during the night, rx w/ SL NTG x 2 and morphine. Currently pain-free. Started at rest.  OBJECTIVE Filed Vitals:   09/13/14 0450 09/13/14 0459 09/13/14 0523 09/13/14 0623  BP: 99/43 99/52 102/49 127/61  Pulse:      Temp:   97.9 F (36.6 C)   TempSrc:   Oral   Resp: Height:      Weight:   158 lb 1.1 oz (71.7 kg)   SpO2: 97% 98% 98% 97%    Intake/Output Summary (Last 24 hours) at 09/13/14 0935 Last data filed at 09/12/14 2100  Gross per 24 hour  Intake 431.96 ml  Output   1550 ml  Net -1118.04 ml   Filed Weights   09/10/14 0140 09/12/14 0558 09/13/14 0523  Weight: 159 lb 6.3 oz (72.3 kg) 158 lb 8 oz (71.895 kg) 158 lb 1.1 oz (71.7 kg)    PHYSICAL EXAM General: Well developed, well nourished, female in no acute distress. Head: Normocephalic, atraumatic.  Neck: Supple without bruits, JVD not elevated. Lungs:  Resp regular and unlabored, CTA. Heart: RRR, S1, S2, no S3, S4, or murmur; no rub. Abdomen: Soft, non-tender, non-distended, BS + x 4.  Extremities: No clubbing, cyanosis, mild edema RUE (IV infiltrated).  Neuro: Alert and oriented X 3. Moves all extremities spontaneously. Psych: Normal affect.  LABS: CBC: Recent Labs  09/12/14 0428 09/13/14 0015  WBC 9.5 7.2  HGB 11.1* 10.6*  HCT 33.5* 32.0*  MCV 80.7 80.4  PLT 209 188    INR: Recent Labs  09/12/14 0428  INR 1.11   Basic Metabolic Panel: Recent Labs  09/12/14 0428  NA 137  K 4.3  CL 103  CO2 26  GLUCOSE 281*  BUN 14  CREATININE 0.74  CALCIUM 8.7*   BNP:  B NATRIURETIC PEPTIDE  Date/Time Value Ref Range Status  09/09/2014 10:14 PM 24.7 0.0 - 100.0 pg/mL Final    TELE: SR, no sig ectopy    Radiology/Studies: Nm Myocar Multi W/spect W/wall Motion / Ef  09/11/2014   CLINICAL DATA:  Chest pain. CABG. Diabetes. Hypertension. Coronary artery disease. Shortness of breath.  EXAM: MYOCARDIAL IMAGING WITH SPECT (REST AND EXERCISE)  GATED LEFT VENTRICULAR WALL MOTION STUDY  LEFT VENTRICULAR EJECTION FRACTION  TECHNIQUE: Standard myocardial SPECT imaging was performed after resting intravenous injection of 10 mCi Tc-50m sestamibi. Subsequently, exercise tolerance test was performed by the patient under the supervision of the Cardiology staff. At peak-stress, 30 mCi Tc-25m sestamibi was injected intravenously and standard myocardial SPECT imaging was performed. Quantitative gated imaging was also performed to evaluate left ventricular wall motion, and estimate left ventricular ejection fraction.  COMPARISON:  01/10/2013  FINDINGS: Perfusion: Moderate sized moderate severity lateral wall inducible ischemia defect on stress images.  Wall Motion: Akinetic septum with associated poor wall thickening.  Left Ventricular Ejection Fraction: 48 %  End diastolic volume 81 ml  End systolic volume 43 ml  IMPRESSION: 1. Moderate-sized moderate severity lateral wall perfusion defect on stress images compatible with inducible ischemia.  .  2. A kinetics septum with associated poor wall thickening.  3. Left ventricular ejection fraction forty-eight%  4. Intermediate-risk stress test findings*.  *2012 Appropriate Use Criteria for Coronary Revascularization Focused Update: J Am Coll Cardiol. 2012;59(9):857-881. http://content.dementiazones.com.aspx?articleid=1201161    Electronically Signed   By: Gaylyn Rong M.D.   On: 09/11/2014 13:26     Current Medications:  . [START ON 09/15/2014] Alirocumab  1 Applicatorful Subcutaneous Q14 Days  . amLODipine  10 mg Oral Daily  . aspirin EC  325 mg Oral QHS  . cholecalciferol  2,000 Units Oral Daily  . insulin aspart  0-15 Units Subcutaneous TID WC  . insulin aspart  0-5 Units Subcutaneous QHS  . insulin glargine  10 Units Subcutaneous BID  . lisinopril  20 mg Oral Daily  . multivitamin with minerals  2 tablet Oral Daily  . pantoprazole  20 mg Oral Daily  . ranolazine  500 mg Oral Q12H  . sodium chloride  3 mL Intravenous Q12H   . heparin 1,000 Units/hr (09/13/14 0230)   TCTS Consult: 09/12/2014 Ultimately the patient may require redo coronary artery bypass grafting in the future, but at the present time I would favor a strategy using multivessel PCI and stenting. This could include PCI and stenting of the short segment high-grade proximal stenosis of the large vein graft to the obtuse marginal branch and PCI and stenting of the native right coronary artery. PCI and stenting of the left main coronary artery could also be considered given continued patency of the left internal mammary artery graft to the distal left anterior descending coronary artery, although this may not be necessary.  I have discussed matters at length with the patient and her family this afternoon. Alternative treatment strategies of been discussed. The importance of long-term risk factor modification on the long-term impact of her underlying disease process has been stressed. All of her questions have been addressed. Please call if we can be of further assistance.  ASSESSMENT AND PLAN: Principal Problem:   Unstable angina - see TCTS note above, for PCI today - continue heparin, add NTG paste - continue ASA, lisinopril - pt likes Dr Katrinka Blazing, wants f/u with [facility], lives in [place], so not sure if she wants f/u in [place] or [place] -  for Cholesterol, is on Praluent every other Friday, rx from [facility] in [place], she may want to change to [place].  Otherwise, per IM Active Problems:   DM2 (diabetes mellitus, type 2)   S/P CABG x 4   COPD (chronic obstructive pulmonary disease)   Benign essential HTN   GERD (gastroesophageal reflux disease)   Dyslipidemia - on Praluent   Elevated troponin   Chest pain   Signed, Barrett, Bjorn Loser , PA-C 9:35 AM 09/13/2014   I have examined the patient and reviewed assessment and plan and discussed with patient.  Agree with above as stated.  For PCI later today: Likely native RCA and SVG to OM.  D/w Dr. Katrinka Blazing.  Patient with CP last night.  Juline Sanderford S.

## 2014-09-13 NOTE — Progress Notes (Signed)
ANTICOAGULATION CONSULT NOTE Pharmacy Consult for Heparin Indication: chest pain/ACS  Allergies  Allergen Reactions  . Hydrocodone Other (See Comments)    Hallucinations  . Metoprolol Other (See Comments)    Muscle weakness  . Norvasc [Amlodipine Besylate] Other (See Comments)    Heartburn  . Percodan [Oxycodone-Aspirin] Other (See Comments)    hallucination  . Statins Other (See Comments)    Muscles ache    Patient Measurements: Height: 5' (152.4 cm) Weight: 158 lb 8 oz (71.895 kg) IBW/kg (Calculated) : 45.5 Heparin Dosing Weight: 60 kg  Vital Signs: Temp: 98.9 F (37.2 C) (07/26 2019) Temp Source: Oral (07/26 2019) BP: 130/87 mmHg (07/26 2019) Pulse Rate: 72 (07/26 2019)  Labs:  Recent Labs  09/10/14 0400 09/10/14 0845 09/12/14 0428 09/12/14 0714 09/13/14 0015  HGB 11.1*  --  11.1*  --  10.6*  HCT 33.5*  --  33.5*  --  32.0*  PLT 210  --  209  --  188  LABPROT  --   --  14.5  --   --   INR  --   --  1.11  --   --   HEPARINUNFRC  --   --   --  0.55 0.28*  CREATININE 0.78  --  0.74  --   --   TROPONINI 0.17* 0.18*  --   --   --     Estimated Creatinine Clearance: 62.9 mL/min (by C-G formula based on Cr of 0.74).  Assessment: 64 y.o. female with multivessel CAD s/p cath, awaiting PCI, for heparin  Goal of Therapy:  Heparin level 0.3-0.7 units/ml Monitor platelets by anticoagulation protocol: Yes   Plan:  Increase Heparin 1000 units/hr Follow-up am labs.   Selah Klang, Gary Fleet 09/13/2014,1:46 AM

## 2014-09-14 ENCOUNTER — Encounter (HOSPITAL_COMMUNITY): Payer: Self-pay | Admitting: Cardiovascular Disease

## 2014-09-14 DIAGNOSIS — Z951 Presence of aortocoronary bypass graft: Secondary | ICD-10-CM

## 2014-09-14 DIAGNOSIS — E1165 Type 2 diabetes mellitus with hyperglycemia: Secondary | ICD-10-CM

## 2014-09-14 DIAGNOSIS — E785 Hyperlipidemia, unspecified: Secondary | ICD-10-CM

## 2014-09-14 DIAGNOSIS — I200 Unstable angina: Secondary | ICD-10-CM

## 2014-09-14 DIAGNOSIS — R7989 Other specified abnormal findings of blood chemistry: Secondary | ICD-10-CM

## 2014-09-14 LAB — BASIC METABOLIC PANEL
ANION GAP: 5 (ref 5–15)
BUN: 13 mg/dL (ref 6–20)
CHLORIDE: 105 mmol/L (ref 101–111)
CO2: 27 mmol/L (ref 22–32)
CREATININE: 0.74 mg/dL (ref 0.44–1.00)
Calcium: 8.9 mg/dL (ref 8.9–10.3)
Glucose, Bld: 228 mg/dL — ABNORMAL HIGH (ref 65–99)
Potassium: 4 mmol/L (ref 3.5–5.1)
Sodium: 137 mmol/L (ref 135–145)

## 2014-09-14 LAB — GLUCOSE, CAPILLARY: Glucose-Capillary: 178 mg/dL — ABNORMAL HIGH (ref 65–99)

## 2014-09-14 LAB — CBC
HCT: 30.4 % — ABNORMAL LOW (ref 36.0–46.0)
Hemoglobin: 10 g/dL — ABNORMAL LOW (ref 12.0–15.0)
MCH: 26.4 pg (ref 26.0–34.0)
MCHC: 32.9 g/dL (ref 30.0–36.0)
MCV: 80.2 fL (ref 78.0–100.0)
Platelets: 175 10*3/uL (ref 150–400)
RBC: 3.79 MIL/uL — AB (ref 3.87–5.11)
RDW: 15 % (ref 11.5–15.5)
WBC: 6.3 10*3/uL (ref 4.0–10.5)

## 2014-09-14 MED ORDER — PANTOPRAZOLE SODIUM 40 MG PO TBEC: 40.0000 mg | DELAYED_RELEASE_TABLET | Freq: Every day | ORAL | Status: DC

## 2014-09-14 MED ORDER — INSULIN GLARGINE 100 UNIT/ML SOLOSTAR PEN: 35.0000 [IU] | PEN_INJECTOR | Freq: Every day | SUBCUTANEOUS | Status: DC

## 2014-09-14 MED ORDER — INSULIN ASPART 100 UNIT/ML FLEXPEN: 3.0000 [IU] | PEN_INJECTOR | Freq: Three times a day (TID) | SUBCUTANEOUS | Status: DC

## 2014-09-14 MED ORDER — ASPIRIN EC 81 MG PO TBEC: 81.0000 mg | DELAYED_RELEASE_TABLET | Freq: Every day | ORAL | Status: DC

## 2014-09-14 MED ORDER — NIACIN ER 250 MG PO CPCR: 250.0000 mg | ORAL_CAPSULE | Freq: Every day | ORAL | Status: DC

## 2014-09-14 MED ORDER — CLOPIDOGREL BISULFATE 75 MG PO TABS: 75.0000 mg | ORAL_TABLET | Freq: Every day | ORAL | Status: DC

## 2014-09-14 NOTE — Discharge Summary (Addendum)
Physician Discharge Summary  DANGELA HOW ZOX:[id number] DOB: 09-12-1950 DOA: 09/09/2014  PCP: Pcp Not In System  Admit date: 09/09/2014 Discharge date: 09/14/2014  Time spent:  Recommendations for Outpatient Follow-up:  1. Follow up with cardiology as an outpatient  Discharge Diagnoses:  Principal Problem:   Unstable angina s/p successful PTCA/DES x1 to mRCA and PTCA/DES x1 to ostium SVG to intermediate Active Problems:   DM2 (diabetes mellitus, type 2)   S/P CABG x 4   COPD (chronic obstructive pulmonary disease)   Benign essential HTN   GERD (gastroesophageal reflux disease)   Dyslipidemia   Elevated troponin   Chest pain   Discharge Condition: stable  Diet recommendation: heart healthy  Filed Weights   09/13/14 0523 09/13/14 2011 09/14/14 0040  Weight: 71.7 kg (158 lb 1.1 oz) 71.7 kg (158 lb 1.1 oz) 71.7 kg (158 lb 1.1 oz)    History of present illness:  64yo woman with PMH of DM2, HLD, CAD s/p CABG, COPD, HTN, GERD who presents for chest pain. She reports that last week she developed chest pain when trying to have sex with her husband. She rested and the symptoms went away. This happened again last evening and improved with rest. Today, she developed chest pain with walking to the bedroom. She took a nitro but the fact that it was so bad scared her and she called EMS. Ms. Kring has been SOB since her CABG surgery in September of last year, but the shortness of breath has worsened in the last week. During this last episode, she had radiation of the pain to her left arm/elbow. She describes the pain as pulling or muscle tightening. She has further associated symptoms of nausea, LE swelling and weight gain of 10 pounds. She further has occasional wheezing and heartburn. She is a former smoker and quit 24 years ago. She further reports an episode of severe cold intolerance during one of these episodes. Given her fatigue and this episode, + swelling, will  check TSH.    Hospital Course:  Unstable Angina: - EKG showed flipped T waves on the lateral leads, one set of troponins elevated. - For stress test on 7.25.2016 showed sign of ischemia.Cardiology recommended cardiac cath on 7 26,016  - S/p successful PTCA/DES x1 to [facility] and PTCA/DES x1 to ostium SVG to intermediate - does not tolerate statin now on Praluent added Niaspan which can be titrate as an outpatient.  DM2 (diabetes mellitus, type 2) - Hemoglobin A1c was 8.7. - increased her lantus ans started her short acting with meals.  Benign essential HTN: - Cont to be elevated, increase norvasc.  GERD (gastroesophageal reflux disease) PPI   Procedures:  PCI on 7.27.2016: s/p successful PTCA/DES x1 to mRCA and PTCA/DES x1 to ostium SVG to intermediate  Consultations:  cardiology  Discharge Exam: Filed Vitals:   09/14/14 0755  BP: 137/51  Pulse: 64  Temp: 98.2 F (36.8 C)  Resp: 16    General: A&O x3 Cardiovascular: rrr Respiratory: good air movement CTA B/L  Discharge Instructions   Discharge Instructions    Diet - low sodium heart healthy    Complete by:  As directed      Increase activity slowly    Complete by:  As directed           Current Discharge Medication List    START taking these medications   Details  clopidogrel (PLAVIX) 75 MG tablet Take 1 tablet (75 mg total) by mouth daily with breakfast.  Qty: 30 tablet, Refills: 1    insulin aspart (NOVOLOG) 100 UNIT/ML FlexPen Inject 3 Units into the skin 3 (three) times daily with meals. Qty: 15 mL, Refills: 11    niacin 250 MG CR capsule Take 1 capsule (250 mg total) by mouth at bedtime. Qty: 30 capsule, Refills: 0    pantoprazole (PROTONIX) 40 MG tablet Take 1 tablet (40 mg total) by mouth daily. Qty: 30 tablet, Refills: 0      CONTINUE these medications which have CHANGED   Details  Insulin Glargine (LANTUS SOLOSTAR) 100 UNIT/ML Solostar Pen Inject 35 Units into the skin at bedtime. Qty: 15  mL, Refills: 11      CONTINUE these medications which have NOT CHANGED   Details  albuterol (PROAIR HFA) 108 (90 BASE) MCG/ACT inhaler Inhale 2 puffs into the lungs every 6 (six) hours as needed for wheezing or shortness of breath.    Alirocumab (PRALUENT) 75 MG/ML SOPN Inject 1 Applicatorful into the skin every 14 (fourteen) days.    amLODipine (NORVASC) 5 MG tablet Take 5 mg by mouth daily.    aspirin EC 325 MG tablet Take 325 mg by mouth at bedtime.    cholecalciferol (VITAMIN D) 1000 UNITS tablet Take 2,000 Units by mouth daily.    CINNAMON PO Take 2,000 mg by mouth daily.     hydrochlorothiazide (HYDRODIURIL) 25 MG tablet Take 25 mg by mouth daily.    lisinopril (PRINIVIL,ZESTRIL) 20 MG tablet Take 20 mg by mouth daily.    Multiple Vitamin (MULTIVITAMIN WITH MINERALS) TABS tablet Take 2 tablets by mouth daily.    ranolazine (RANEXA) 500 MG 12 hr tablet Take 500 mg by mouth every 12 (twelve) hours.      STOP taking these medications     lansoprazole (PREVACID) 15 MG capsule        Allergies  Allergen Reactions  . Hydrocodone Other (See Comments)    Hallucinations  . Metoprolol Other (See Comments)    Muscle weakness  . Norvasc [Amlodipine Besylate] Other (See Comments)    Heartburn  . Percodan [Oxycodone-Aspirin] Other (See Comments)    hallucination  . Statins Other (See Comments)    Muscles ache   Follow-up Information    Follow up with Corine Shelter PA-C.   Why:  [facility] - [place] office - 09/21/14 at 1pm. You will see one of our PAs for your first post-hospital visit. We have also sent a message to our pharmacist to see how we get you back on track with Praluent. Someone will call you with this info.   Contact information:   [address]  At [facility] [place], [place] [address] [phone]       The results of significant diagnostics from this hospitalization (including imaging, microbiology, ancillary and laboratory) are listed below for  reference.    Significant Diagnostic Studies: Dg Chest 2 View  09/09/2014   CLINICAL DATA:  Acute onset of left-sided chest pain and shortness of breath. Initial encounter.  EXAM: CHEST  2 VIEW  COMPARISON:  Chest radiograph performed 01/09/2013  FINDINGS: The lungs are well-aerated. Mild bibasilar airspace opacities may reflect atelectasis or mild pneumonia. There is no evidence of pleural effusion or pneumothorax.  The heart is borderline normal in size. The patient is status post median sternotomy. No acute osseous abnormalities are seen. The patient is status post left-sided rotator cuff repair.  IMPRESSION: Mild bibasilar airspace opacities may reflect atelectasis or mild pneumonia.   Electronically Signed  By: Roanna Raider M.D.   On: 09/09/2014 23:04   Nm Myocar Multi W/spect W/wall Motion / Ef  09/11/2014   CLINICAL DATA:  Chest pain. CABG. Diabetes. Hypertension. Coronary artery disease. Shortness of breath.  EXAM: MYOCARDIAL IMAGING WITH SPECT (REST AND EXERCISE)  GATED LEFT VENTRICULAR WALL MOTION STUDY  LEFT VENTRICULAR EJECTION FRACTION  TECHNIQUE: Standard myocardial SPECT imaging was performed after resting intravenous injection of 10 mCi Tc-63m sestamibi. Subsequently, exercise tolerance test was performed by the patient under the supervision of the Cardiology staff. At peak-stress, 30 mCi Tc-44m sestamibi was injected intravenously and standard myocardial SPECT imaging was performed. Quantitative gated imaging was also performed to evaluate left ventricular wall motion, and estimate left ventricular ejection fraction.  COMPARISON:  01/10/2013  FINDINGS: Perfusion: Moderate sized moderate severity lateral wall inducible ischemia defect on stress images.  Wall Motion: Akinetic septum with associated poor wall thickening.  Left Ventricular Ejection Fraction: 48 %  End diastolic volume 81 ml  End systolic volume 43 ml  IMPRESSION: 1. Moderate-sized moderate severity lateral wall perfusion  defect on stress images compatible with inducible ischemia.  .  2. A kinetics septum with associated poor wall thickening.  3. Left ventricular ejection fraction forty-eight%  4. Intermediate-risk stress test findings*.  *2012 Appropriate Use Criteria for Coronary Revascularization Focused Update: J Am Coll Cardiol. 2012;59(9):857-881. http://content.dementiazones.com.aspx?articleid=1201161   Electronically Signed   By: Gaylyn Rong M.D.   On: 09/11/2014 13:26    Microbiology: No results found for this or any previous visit (from the past 240 hour(s)).   Labs: Basic Metabolic Panel:  Recent Labs Lab 09/09/14 2214 09/10/14 0400 09/12/14 0428 09/13/14 1300 09/14/14 0318  NA 135  --  137 136 137  K 4.0  --  4.3 4.4 4.0  CL 102  --  103 102 105  CO2 20*  --  26 28 27   GLUCOSE 347*  --  281* 163* 228*  BUN 15  --  14 15 13   CREATININE 0.89 0.78 0.74 0.70 0.74  CALCIUM 9.3  --  8.7* 9.2 8.9   Liver Function Tests: No results for input(s): AST, ALT, ALKPHOS, BILITOT, PROT, ALBUMIN in the last 168 hours. No results for input(s): LIPASE, AMYLASE in the last 168 hours. No results for input(s): AMMONIA in the last 168 hours. CBC:  Recent Labs Lab 09/09/14 2214 09/10/14 0400 09/12/14 0428 09/13/14 0015 09/14/14 0318  WBC 9.0 9.3 9.5 7.2 6.3  HGB 12.4 11.1* 11.1* 10.6* 10.0*  HCT 37.4 33.5* 33.5* 32.0* 30.4*  MCV 78.9 79.4 80.7 80.4 80.2  PLT 246 210 209 188 175   Cardiac Enzymes:  Recent Labs Lab 09/10/14 0400 09/10/14 0845  TROPONINI 0.17* 0.18*   BNP: BNP (last 3 results)  Recent Labs  09/09/14 2214  BNP 24.7    ProBNP (last 3 results) No results for input(s): PROBNP in the last 8760 hours.  CBG:  Recent Labs Lab 09/13/14 0801 09/13/14 1132 09/13/14 1916 09/13/14 2039 09/14/14 0611  GLUCAP 198* 189* 109* 103* 178*       Signed:  Marinda Elk  [facility] 09/14/2014, 12:31 PM

## 2014-09-14 NOTE — Research (Signed)
After consenting patient to DAL-GenE research study and reviewing chart patient did not meet inclusion and patient stated " didn't want to do study". Patient wanted original consent and ICF was given to her. Inclusion criteria on native epicardial artery stent was not met. (DES was SVG).

## 2014-09-14 NOTE — Research (Signed)
DAL-GenE Informed Consent   Subject Name: Debbie [name]  Subject met inclusion and exclusion criteria.  The informed consent form, study requirements and expectations were reviewed with the subject and questions and concerns were addressed prior to the signing of the consent form.  The subject verbalized understanding of the trail requirements.  The subject agreed to participate in the DAL-GenE trial and signed the informed consent.  The informed consent was obtained prior to performance of any protocol-specific procedures for the subject.  A copy of the signed informed consent was given to the subject and a copy was placed in the subject's medical record.  Hedrick,Tammy W 09/14/2014, 0900

## 2014-09-14 NOTE — Progress Notes (Signed)
Has no recurrent pain. Very small vessels. Ready for discharge.

## 2014-09-14 NOTE — Progress Notes (Signed)
Patient: Debbie [name] / Admit Date: 09/09/2014 / Date of Encounter: 09/14/2014, 9:00 AM   Subjective: Feeling well. No complaints.   Objective: Telemetry: NSR rare PVC Physical Exam: Blood pressure 137/40, pulse 64, temperature 98.3 F (36.8 C), temperature source Oral, resp. rate 18, height 5' (1.524 m), weight 158 lb 1.1 oz (71.7 kg), SpO2 97 %. General: Well developed, well nourished WF in no acute distress Head: Normocephalic, atraumatic, sclera non-icteric, no xanthomas, nares are without discharge. Neck: Negative for carotid bruits. JVP not elevated. Lungs: Clear bilaterally to auscultation without wheezes, rales, or rhonchi. Breathing is unlabored. Heart: RRR S1 S2 without murmurs, rubs, or gallops.  Abdomen: Soft, non-tender, non-distended with normoactive bowel sounds. No rebound/guarding.  Extremities: No clubbing or cyanosis. No edema. Distal pedal pulses are 2+ and equal bilaterally. Right groin cath site without hematoma, ecchymosis or bruit Neuro: Alert and oriented X 3. Moves all extremities spontaneously. Psych:  Responds to questions appropriately with a normal affect.   Intake/Output Summary (Last 24 hours) at 09/14/14 0900 Last data filed at 09/14/14 0800  Gross per 24 hour  Intake    480 ml  Output   1700 ml  Net  -1220 ml    Inpatient Medications:  . [START ON 09/15/2014] Alirocumab  1 Applicatorful Subcutaneous Q14 Days  . amLODipine  10 mg Oral Daily  . aspirin EC  81 mg Oral QHS  . cholecalciferol  2,000 Units Oral Daily  . clopidogrel  75 mg Oral Q breakfast  . insulin aspart  0-15 Units Subcutaneous TID WC  . insulin aspart  0-5 Units Subcutaneous QHS  . insulin glargine  15 Units Subcutaneous BID  . lisinopril  20 mg Oral Daily  . multivitamin with minerals  2 tablet Oral Daily  . niacin  250 mg Oral QHS  . pantoprazole  20 mg Oral Daily  . ranolazine  500 mg Oral Q12H  . sodium chloride  3 mL Intravenous Q12H  . sodium chloride  3 mL  Intravenous Q12H   Infusions:    Labs:  Recent Labs  09/13/14 1300 09/14/14 0318  NA 136 137  K 4.4 4.0  CL 102 105  CO2 28 27  GLUCOSE 163* 228*  BUN 15 13  CREATININE 0.70 0.74  CALCIUM 9.2 8.9   No results for input(s): AST, ALT, ALKPHOS, BILITOT, PROT, ALBUMIN in the last 72 hours.  Recent Labs  09/13/14 0015 09/14/14 0318  WBC 7.2 6.3  HGB 10.6* 10.0*  HCT 32.0* 30.4*  MCV 80.4 80.2  PLT 188 175   No results for input(s): CKTOTAL, CKMB, TROPONINI in the last 72 hours. Invalid input(s): POCBNP No results for input(s): HGBA1C in the last 72 hours.   Radiology/Studies:  Dg Chest 2 View  09/09/2014   CLINICAL DATA:  Acute onset of left-sided chest pain and shortness of breath. Initial encounter.  EXAM: CHEST  2 VIEW  COMPARISON:  Chest radiograph performed 01/09/2013  FINDINGS: The lungs are well-aerated. Mild bibasilar airspace opacities may reflect atelectasis or mild pneumonia. There is no evidence of pleural effusion or pneumothorax.  The heart is borderline normal in size. The patient is status post median sternotomy. No acute osseous abnormalities are seen. The patient is status post left-sided rotator cuff repair.  IMPRESSION: Mild bibasilar airspace opacities may reflect atelectasis or mild pneumonia.   Electronically Signed   By: Roanna Raider M.D.   On: 09/09/2014 23:04   Nm Myocar Multi W/spect W/wall Motion / Ef  09/11/2014   CLINICAL DATA:  Chest pain. CABG. Diabetes. Hypertension. Coronary artery disease. Shortness of breath.  EXAM: MYOCARDIAL IMAGING WITH SPECT (REST AND EXERCISE)  GATED LEFT VENTRICULAR WALL MOTION STUDY  LEFT VENTRICULAR EJECTION FRACTION  TECHNIQUE: Standard myocardial SPECT imaging was performed after resting intravenous injection of 10 mCi Tc-28m sestamibi. Subsequently, exercise tolerance test was performed by the patient under the supervision of the Cardiology staff. At peak-stress, 30 mCi Tc-76m sestamibi was injected intravenously and  standard myocardial SPECT imaging was performed. Quantitative gated imaging was also performed to evaluate left ventricular wall motion, and estimate left ventricular ejection fraction.  COMPARISON:  01/10/2013  FINDINGS: Perfusion: Moderate sized moderate severity lateral wall inducible ischemia defect on stress images.  Wall Motion: Akinetic septum with associated poor wall thickening.  Left Ventricular Ejection Fraction: 48 %  End diastolic volume 81 ml  End systolic volume 43 ml  IMPRESSION: 1. Moderate-sized moderate severity lateral wall perfusion defect on stress images compatible with inducible ischemia.  .  2. A kinetics septum with associated poor wall thickening.  3. Left ventricular ejection fraction forty-eight%  4. Intermediate-risk stress test findings*.  *2012 Appropriate Use Criteria for Coronary Revascularization Focused Update: J Am Coll Cardiol. 2012;59(9):857-881. http://content.dementiazones.com.aspx?articleid=1201161   Electronically Signed   By: Gaylyn Rong M.D.   On: 09/11/2014 13:26     Assessment and Plan  1. Unstable angina/CAD - prior hx of CABG in [facility] last year ([place]) - found to have recurrent stenosis this admission by cath, s/p successful PTCA/DES x1 to [facility] and PTCA/DES x1 to ostium SVG to intermediate. DAPT for at least one year is recommended. Reduce dose of aspirin to 81mg  daily while on Plavix. She is on Praluent. H/o intolerance to BB. OK for DC from our standpoint. - The patient says she wishes to change to [place] office - have made f/u with Corine Shelter PA-C 09/21/14 at 1pm. I also sent a message to our [facility] pharmacy team to find out what needs to happen for her to remain on Praluent since she was getting this in [place]. The office will call her with this info.  2. Diabetes mellitus uncontrolled A1C 8.7 - f/u PCP. 3. Essential HTN, labile at times but controlled this AM. If further med titration needed would consider increasing ACEI  vs resume home HCTZ 4. GERD 5. COPD  6. Dyslipidemia, on Praluent    Signed, Maryruth Hancock Pager: [phone]

## 2014-09-14 NOTE — Progress Notes (Signed)
CARDIAC REHAB PHASE I   PRE:  Rate/Rhythm: walking independently    BP: sitting 137/51    SaO2:   MODE:  Ambulation: 1050  POST:  Rate/Rhythm: 80 SR    BP: sitting 149/46    SaO2:   Pt out walking independently with daughter. Denied CP, sts her walk was easy. Ed completed however pt had considerably resistance. Seems flat and frustrated by her situation. Tried to encourage CPRII (she was not encouraged to do this after CABG) for emotional support however she sts it is too much gas.  [id number]  Elissa Lovett [place] CES, ACSM 09/14/2014 8:49 AM

## 2014-09-14 NOTE — Discharge Instructions (Signed)
No driving for 2 days. No lifting over 5 lbs for 1 week. No sexual activity for 1 week. Keep procedure site clean & dry. If you notice increased pain, swelling, bleeding or pus, call/return!  You may shower, but no soaking baths/hot tubs/pools for 1 week.   

## 2014-09-21 ENCOUNTER — Encounter: Payer: Self-pay | Admitting: Cardiology

## 2014-09-21 ENCOUNTER — Ambulatory Visit (INDEPENDENT_AMBULATORY_CARE_PROVIDER_SITE_OTHER): Payer: 59 | Admitting: Cardiology

## 2014-09-21 VITALS — BP 128/60 | HR 66 | Ht 60.0 in | Wt 156.0 lb

## 2014-09-21 DIAGNOSIS — E119 Type 2 diabetes mellitus without complications: Secondary | ICD-10-CM

## 2014-09-21 DIAGNOSIS — R9431 Abnormal electrocardiogram [ECG] [EKG]: Secondary | ICD-10-CM | POA: Insufficient documentation

## 2014-09-21 DIAGNOSIS — Z9861 Coronary angioplasty status: Secondary | ICD-10-CM

## 2014-09-21 DIAGNOSIS — Z951 Presence of aortocoronary bypass graft: Secondary | ICD-10-CM | POA: Diagnosis not present

## 2014-09-21 DIAGNOSIS — I10 Essential (primary) hypertension: Secondary | ICD-10-CM

## 2014-09-21 DIAGNOSIS — I2510 Atherosclerotic heart disease of native coronary artery without angina pectoris: Secondary | ICD-10-CM

## 2014-09-21 DIAGNOSIS — E785 Hyperlipidemia, unspecified: Secondary | ICD-10-CM | POA: Diagnosis not present

## 2014-09-21 MED ORDER — ASPIRIN EC 81 MG PO TBEC: 81.0000 mg | DELAYED_RELEASE_TABLET | Freq: Every day | ORAL | Status: AC

## 2014-09-21 NOTE — Assessment & Plan Note (Signed)
On Pralulent

## 2014-09-21 NOTE — Patient Instructions (Signed)
Your physician recommends that you schedule a follow-up appointment in: 3 months with Dr.Branch  Start taking Aspirin 81 mg Daily  Thank you for choosing [facility]!

## 2014-09-21 NOTE — Assessment & Plan Note (Signed)
Controlled.  

## 2014-09-21 NOTE — Progress Notes (Signed)
09/21/2014 Debbie [name]   09/03/1950  [id number]  Primary Physician Pcp Not In System Primary Cardiologist: Dr Wyline Mood (new)  HPI:  64 y/o with a history of CABG x 4 at [facility] ([place]) in 2015. She was admitted 09/10/14 with chest pain similar to her pre CABG symptoms. She had an abnormal EKG with TWI and elevated Troponin. Myoview 09/11/14 was abnormal and she underwent diagnostic cath 09/12/14. She was found to have stenosis in the SVG-Int and SVG-RCA.  Her LVF was NL. After review with Dr Cornelius Moras it was decided to proceed with PCI.  On 09/13/14 she underwent successful PTCA/DES x1 to native RCA and PTCA/DES x1 to SVG to intermediate.   She is in the office today for follow up. She has occasional Lt sided "burning" chest pain that is similar to her pre admission symptoms though not as severe. Her symptoms are not exertional or positional. She does not limit her activity or have to take NTG.    Current Outpatient Prescriptions  Medication Sig Dispense Refill  . albuterol (PROAIR HFA) 108 (90 BASE) MCG/ACT inhaler Inhale 2 puffs into the lungs every 6 (six) hours as needed for wheezing or shortness of breath.    . Alirocumab (PRALUENT) 75 MG/ML SOPN Inject 1 Applicatorful into the skin every 14 (fourteen) days.    [name] amLODipine (NORVASC) 5 MG tablet Take 5 mg by mouth daily.    . cholecalciferol (VITAMIN D) 1000 UNITS tablet Take 2,000 Units by mouth daily.    [name] CINNAMON PO Take 2,000 mg by mouth daily.     . clopidogrel (PLAVIX) 75 MG tablet Take 1 tablet (75 mg total) by mouth daily with breakfast. 30 tablet 1  . hydrochlorothiazide (HYDRODIURIL) 25 MG tablet Take 25 mg by mouth daily.    . insulin aspart (NOVOLOG) 100 UNIT/ML FlexPen Inject 3 Units into the skin 3 (three) times daily with meals. 15 mL 11  . Insulin Glargine (LANTUS SOLOSTAR) 100 UNIT/ML Solostar Pen Inject 35 Units into the skin at bedtime. 15 mL 11  . lisinopril (PRINIVIL,ZESTRIL) 20 MG tablet Take 20 mg by mouth  daily.    . Multiple Vitamin (MULTIVITAMIN WITH MINERALS) TABS tablet Take 2 tablets by mouth daily.    . niacin 250 MG CR capsule Take 1 capsule (250 mg total) by mouth at bedtime. 30 capsule 0  . pantoprazole (PROTONIX) 40 MG tablet Take 1 tablet (40 mg total) by mouth daily. 30 tablet 0  . ranolazine (RANEXA) 500 MG 12 hr tablet Take 500 mg by mouth every 12 (twelve) hours.    [name] aspirin EC 81 MG tablet Take 1 tablet (81 mg total) by mouth daily. 30 tablet 3   No current facility-administered medications for this visit.    Allergies  Allergen Reactions  . Hydrocodone Other (See Comments)    Hallucinations  . Metoprolol Other (See Comments)    Muscle weakness  . Percodan [Oxycodone-Aspirin] Other (See Comments)    hallucination  . Statins Other (See Comments)    Muscles ache    History   Social History  . Marital Status: Married    Spouse Name: N/A  . Number of Children: N/A  . Years of Education: N/A   Occupational History  . Not on file.   Social History Main Topics  . Smoking status: Former Smoker    Quit date: 08/29/1988  . Smokeless tobacco: Not on file  . Alcohol Use: No  . Drug Use: No  .  Sexual Activity: Not on file   Other Topics Concern  . Not on file   Social History Narrative     Review of Systems: General: negative for chills, fever, night sweats or weight changes.  Cardiovascular: negative for chest pain, dyspnea on exertion, edema, orthopnea, palpitations, paroxysmal nocturnal dyspnea or shortness of breath Dermatological: negative for rash Respiratory: negative for cough or wheezing Urologic: negative for hematuria Abdominal: negative for nausea, vomiting, diarrhea, bright red blood per rectum, melena, or hematemesis Neurologic: negative for visual changes, syncope, or dizziness All other systems reviewed and are otherwise negative except as noted above.    Blood pressure 128/60, pulse 66, height 5' (1.524 m), weight 156 lb (70.761 kg), SpO2  98 %.  General appearance: alert, cooperative and no distress Neck: no carotid bruit and no JVD Lungs: clear to auscultation bilaterally Heart: regular rate and rhythm Extremities: no edema Skin: Skin color, texture, turgor normal. No rashes or lesions Neurologic: Grossly normal  EKG 09/14/14 - NSR with septal Qs and ant/lat TWI  ASSESSMENT AND PLAN:   CAD -S/P SVG-Int and RCA DES 09/13/14 She continues to have some chest pain, I'm not sure its angina, overall she is comfortable with her current level of symptoms.  S/P CABG x 4- Sept 2015 LIMA to LAD, SVG to ramus, SVG to OM, SVG to RCA, EVH via left thigh and leg - Dr. Linna Darner at [facility]  Dyslipidemia On Pralulent  DM2 (diabetes mellitus, type 2) Type 2 IDDDM, NL renal function  Benign essential HTN Controlled   PLAN  I decreased her ASA to 81 mg daily. I discussed possibly increasing her Ranexa but at this time she feels her chest pain is not a problem and she would like to avoid any medications changes (besides decreasing her ASA). I will arrange for a f/u with a cardiologist her in [place] as she would like to be followed here in the future.   Corine Shelter K PA-C 09/21/2014 1:04 PM

## 2014-09-21 NOTE — Assessment & Plan Note (Signed)
She continues to have some chest pain, I'm not sure its angina, overall she is comfortable with her current level of symptoms.

## 2014-09-21 NOTE — Assessment & Plan Note (Signed)
LIMA to LAD, SVG to ramus, SVG to OM, SVG to RCA, EVH via left thigh and leg - Dr. Linna Darner at [facility]

## 2014-09-21 NOTE — Assessment & Plan Note (Signed)
Type 2 IDDDM, NL renal function

## 2014-10-09 ENCOUNTER — Other Ambulatory Visit: Payer: Self-pay | Admitting: *Deleted

## 2014-10-09 MED ORDER — PANTOPRAZOLE SODIUM 40 MG PO TBEC: 40.0000 mg | DELAYED_RELEASE_TABLET | Freq: Every day | ORAL | Status: DC

## 2014-10-09 MED ORDER — CLOPIDOGREL BISULFATE 75 MG PO TABS: 75.0000 mg | ORAL_TABLET | Freq: Every day | ORAL | Status: DC

## 2014-10-24 ENCOUNTER — Encounter: Payer: Self-pay | Admitting: Cardiology

## 2014-11-10 ENCOUNTER — Ambulatory Visit (INDEPENDENT_AMBULATORY_CARE_PROVIDER_SITE_OTHER): Payer: 59

## 2014-11-10 ENCOUNTER — Ambulatory Visit (INDEPENDENT_AMBULATORY_CARE_PROVIDER_SITE_OTHER): Payer: 59 | Admitting: Podiatry

## 2014-11-10 ENCOUNTER — Encounter: Payer: Self-pay | Admitting: Podiatry

## 2014-11-10 DIAGNOSIS — M79673 Pain in unspecified foot: Secondary | ICD-10-CM

## 2014-11-10 DIAGNOSIS — M722 Plantar fascial fibromatosis: Secondary | ICD-10-CM

## 2014-11-10 DIAGNOSIS — M779 Enthesopathy, unspecified: Secondary | ICD-10-CM

## 2014-11-10 MED ORDER — TRIAMCINOLONE ACETONIDE 10 MG/ML IJ SUSP
10.0000 mg | Freq: Once | INTRAMUSCULAR | Status: AC
  Administered 2014-11-10: 10 mg

## 2014-11-10 NOTE — Progress Notes (Signed)
   Subjective:    Patient ID: Debbie [name], female    DOB: 12/04/1950, 64 y.o.   MRN: [id number]  HPI Patient presents with bilateral foot pain. On the Right foot-heel; on Left foot - All toes hurt. Pt stated, "thinks Left foot hurts because strained from the pain in their right foot". This has been going on for the past 1 1/2 years.  Pt is diabetic-Type II. Sugar levels were 176 this morning; AIC is 8.4. Pt has been diabetic for 28 years.   Review of Systems  Constitutional: Positive for fatigue and unexpected weight change.  Respiratory: Positive for cough and shortness of breath.   Hematological: Bruises/bleeds easily.  All other systems reviewed and are negative.      Objective:   Physical Exam        Assessment & Plan:

## 2014-11-10 NOTE — Patient Instructions (Signed)

## 2014-11-12 NOTE — Progress Notes (Signed)
Subjective:     Patient ID: Debbie [name], female   DOB: August 30, 1950, 64 y.o.   MRN: [id number]  HPI patient presents with very painful right plantar heel at the insertional point tendon into the calcaneus and is also noted to have moderate discomfort when trying to walk. Pain is worse when getting up in the morning and after periods of sitting   Review of Systems  All other systems reviewed and are negative.      Objective:   Physical Exam  Constitutional: She is oriented to person, place, and time.  Cardiovascular: Intact distal pulses.   Musculoskeletal: Normal range of motion.  Neurological: She is oriented to person, place, and time.  Skin: Skin is warm.  Nursing note and vitals reviewed.  neurovascular status intact with muscle strength adequate range of motion within normal limits. Patient's noted to have good digital perfusion is well oriented 3 and has exquisite discomfort plantar aspect right heel at the insertional point tendon into the calcaneus with inflammation and fluid around the medial band. Patient is noted to have discomfort in the left foot also but it appears to be more compensatory in nature and has good digital perfusion and is well oriented 3     Assessment:     Inflammatory plantar fasciitis right with probable compensatory discomfort in the left foot    Plan:     H&P and x-rays reviewed. Today I injected the plantar fascia right 3 mg Kenalog 5 mg Xylocaine and instructed on physical therapy and went ahead and dispensed fascial brace for day and night splint to wear when sleeping due to so much morning pain. Patient will be seen back to reevaluate again in the next several weeks

## 2014-11-17 ENCOUNTER — Ambulatory Visit (INDEPENDENT_AMBULATORY_CARE_PROVIDER_SITE_OTHER): Payer: 59 | Admitting: Podiatry

## 2014-11-17 ENCOUNTER — Encounter: Payer: Self-pay | Admitting: Podiatry

## 2014-11-17 VITALS — BP 145/65 | HR 78 | Resp 16

## 2014-11-17 DIAGNOSIS — M722 Plantar fascial fibromatosis: Secondary | ICD-10-CM

## 2014-11-17 MED ORDER — TRIAMCINOLONE ACETONIDE 10 MG/ML IJ SUSP
10.0000 mg | Freq: Once | INTRAMUSCULAR | Status: AC
  Administered 2014-11-17: 10 mg

## 2014-11-17 NOTE — Progress Notes (Signed)
Subjective:     Patient ID: Debbie [name], female   DOB: [birth year], 64 y.o.   MRN: [id number]  HPI patient presents stating my heel feels a lot better but there is one spot that still sore   Review of Systems     Objective:   Physical Exam Neurovascular status intact with significant diminishment in discomfort but one area that still is quite tender in the mid plantar fascia at the insertion    Assessment:     Planter fasciitis right doing much better with pain 1 spot    Plan:     Reinjected the painful area and advised on physical therapy continued night splint usage and fascial brace usage and reappoint as needed

## 2014-12-21 ENCOUNTER — Ambulatory Visit: Payer: 59 | Admitting: Cardiology

## 2014-12-26 ENCOUNTER — Encounter: Payer: Self-pay | Admitting: Cardiology

## 2014-12-26 ENCOUNTER — Ambulatory Visit (INDEPENDENT_AMBULATORY_CARE_PROVIDER_SITE_OTHER): Payer: 59 | Admitting: Cardiology

## 2014-12-26 VITALS — BP 132/68 | HR 86 | Ht 60.0 in | Wt 158.0 lb

## 2014-12-26 DIAGNOSIS — E785 Hyperlipidemia, unspecified: Secondary | ICD-10-CM

## 2014-12-26 DIAGNOSIS — I2510 Atherosclerotic heart disease of native coronary artery without angina pectoris: Secondary | ICD-10-CM

## 2014-12-26 NOTE — Patient Instructions (Signed)
Medication Instructions:  DISCONTINUE NIACIN  Labwork: WE WILL GET A COPY OF YOUR LAB WORK FROM YOUR PCP  Testing/Procedures: NONE  Follow-Up:Your physician wants you to follow-up in: 6 months with Dr. Wyline MoodBranch. You will receive a reminder letter in the mail two months in advance. If you don't receive a letter, please call our office to schedule the follow-up appointment.    Any Other Special Instructions Will Be Listed Below (If Applicable).     If you need a refill on your cardiac medications before your next appointment, please call your pharmacy. Thanks for choosing [facility]!!!

## 2014-12-26 NOTE — Progress Notes (Signed)
Patient ID: Debbie [name], female   DOB: 1950-11-02, 64 y.o.   MRN: [id number]     Clinical Summary Debbie [name] is a 64 y.o.female last seen by PA Kilroy, this is our first visit together. She is seen for the following medical problems.  1. CAD - [facility] - admit 08/2014 with elevated troponin. Cath done 08/2014 showed disease of her SVG-int and SVG-RCA. She had DES placed to native RCA and DES to SVG-intermediate.  - long history of chest pain. Left sided squeezing pain 1-2/10. No other associated symptoms. Can happen anytime. No relation to food. Not positional. Occurs every other day. Lasts approx 1 minute. Stable symptoms - compliant with meds. Ranexa stopped by her previous cardiologist - allergic to statins due to muscle aches. She is on praluent.   2. HTN - compliant with meds - does not check at home.   3. Hyperlipidemia - intolerant to statins. She has been on praluent.     Past Medical History  Diagnosis Date  . Diabetes mellitus   . Hypertension   . CAD (coronary artery disease)   . COPD (chronic obstructive pulmonary disease)   . GERD (gastroesophageal reflux disease)   . 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      Allergies  Allergen Reactions  . Hydrocodone Other (See Comments)    Hallucinations  . Metoprolol Other (See Comments)    Muscle weakness  . Percodan [Oxycodone-Aspirin] Other (See Comments)    hallucination  . Statins Other (See Comments)    Muscles ache     Current Outpatient Prescriptions  Medication Sig Dispense Refill  . albuterol (PROAIR HFA) 108 (90 BASE) MCG/ACT inhaler Inhale 2 puffs into the lungs every 6 (six) hours as needed for wheezing or shortness of breath.    . Alirocumab (PRALUENT) 75 MG/ML SOPN Inject 1 Applicatorful into the skin every 14 (fourteen) days.    [name].  amLODipine (NORVASC) 5 MG tablet Take 5 mg by mouth daily.    [name]. aspirin EC 81 MG tablet Take 1 tablet (81 mg total) by mouth daily. 30 tablet 3  . cholecalciferol (VITAMIN D) 1000 UNITS tablet Take 2,000 Units by mouth daily.    [name]. CINNAMON PO Take 2,000 mg by mouth daily.     . clopidogrel (PLAVIX) 75 MG tablet Take 1 tablet (75 mg total) by mouth daily with breakfast. 30 tablet 3  . hydrochlorothiazide (HYDRODIURIL) 25 MG tablet Take 25 mg by mouth daily.    . insulin aspart (NOVOLOG) 100 UNIT/ML FlexPen Inject 3 Units into the skin 3 (three) times daily with meals. 15 mL 11  . Insulin Glargine (LANTUS SOLOSTAR) 100 UNIT/ML Solostar Pen Inject 35 Units into the skin at bedtime. 15 mL 11  . lisinopril (PRINIVIL,ZESTRIL) 20 MG tablet Take 20 mg by mouth daily.    . Multiple Vitamin (MULTIVITAMIN WITH MINERALS) TABS tablet Take 2 tablets by mouth daily.    . niacin 250 MG CR capsule Take 1 capsule (250 mg total) by mouth at bedtime. 30 capsule 0  . pantoprazole (PROTONIX) 40 MG tablet Take 1 tablet (40 mg total) by mouth daily. 30 tablet 3  . ranolazine (RANEXA) 500 MG 12 hr tablet Take 500 mg by mouth every 12 (twelve) hours.  No current facility-administered medications for this visit.     Past Surgical History  Procedure Laterality Date  . Coronary artery bypass graft  10/28/2013    CABG x4 using LIMA to LAD, SVG to ramus, SVG to OM, SVG to RCA, EVH via left thigh and leg - Dr. Linna Darner  . Abdominal hysterectomy    . Cardiac catheterization N/A 09/12/2014    Procedure: Left Heart Cath and Coronary Angiography;  Surgeon: Lyn Records, MD;  Location: [facility] INVASIVE CV LAB;  Service: Cardiovascular;  Laterality: N/A;  . Cardiac catheterization N/A 09/13/2014    Procedure: Coronary Stent Intervention;  Surgeon: Kathleene Hazel, MD;  Location: [facility] INVASIVE CV LAB;  Service: Cardiovascular;  Laterality: N/A;     Allergies  Allergen Reactions  . Hydrocodone Other (See Comments)     Hallucinations  . Metoprolol Other (See Comments)    Muscle weakness  . Percodan [Oxycodone-Aspirin] Other (See Comments)    hallucination  . Statins Other (See Comments)    Muscles ache      No family history on file.   Social History Debbie [name] reports that she quit smoking about 26 years ago. She does not have any smokeless tobacco history on file. Debbie [name] reports that she does not drink alcohol.   Review of Systems CONSTITUTIONAL: No weight loss, fever, chills, weakness or fatigue.  HEENT: Eyes: No visual loss, blurred vision, double vision or yellow sclerae.No hearing loss, sneezing, congestion, runny nose or sore throat.  SKIN: No rash or itching.  CARDIOVASCULAR: per HPI RESPIRATORY: No shortness of breath, cough or sputum.  GASTROINTESTINAL: No anorexia, nausea, vomiting or diarrhea. No abdominal pain or blood.  GENITOURINARY: No burning on urination, no polyuria NEUROLOGICAL: No headache, dizziness, syncope, paralysis, ataxia, numbness or tingling in the extremities. No change in bowel or bladder control.  MUSCULOSKELETAL: No muscle, back pain, joint pain or stiffness.  LYMPHATICS: No enlarged nodes. No history of splenectomy.  PSYCHIATRIC: No history of depression or anxiety.  ENDOCRINOLOGIC: No reports of sweating, cold or heat intolerance. No polyuria or polydipsia.  [name]   Physical Examination Filed Vitals:   12/26/14 0823  BP: 132/68  Pulse: 86   Filed Vitals:   12/26/14 0823  Height: 5' (1.524 m)  Weight: 158 lb (71.668 kg)    Gen: resting comfortably, no acute distress HEENT: no scleral icterus, pupils equal round and reactive, no palptable cervical adenopathy,  CV: RRR, no m/r/g., nojvd Resp: Clear to auscultation bilaterally GI: abdomen is soft, non-tender, non-distended, normal bowel sounds, no hepatosplenomegaly MSK: extremities are warm, no edema.  Skin: warm, no rash Neuro:  no focal deficits Psych: appropriate  affect      Assessment and Plan  1. CAD - most recent stents 08/2014 as described above - she has chronic mid left sided chest pain that is unchanged. Much different from the pain prior to her last stents - continue current meds  2. HTN - at goal, continue current meds  3. Hyperlipidemia - continue praluent. No clinical benefit with niacin, will stop   F/u 6 months      Antoine Poche, M.D.

## 2015-02-07 ENCOUNTER — Other Ambulatory Visit: Payer: Self-pay | Admitting: Cardiology

## 2015-02-07 NOTE — Telephone Encounter (Signed)
E sent to pharmacy 

## 2015-03-09 ENCOUNTER — Other Ambulatory Visit: Payer: Self-pay | Admitting: Cardiology

## 2015-06-29 ENCOUNTER — Other Ambulatory Visit: Payer: Self-pay | Admitting: Cardiology

## 2015-07-25 ENCOUNTER — Other Ambulatory Visit: Payer: Self-pay | Admitting: Cardiology

## 2016-07-22 DIAGNOSIS — G479 Sleep disorder, unspecified: Secondary | ICD-10-CM | POA: Insufficient documentation

## 2016-07-23 DIAGNOSIS — E1140 Type 2 diabetes mellitus with diabetic neuropathy, unspecified: Secondary | ICD-10-CM | POA: Insufficient documentation

## 2016-07-23 DIAGNOSIS — E538 Deficiency of other specified B group vitamins: Secondary | ICD-10-CM | POA: Insufficient documentation

## 2016-08-04 ENCOUNTER — Ambulatory Visit: Payer: Medicare Other | Attending: Neurology

## 2016-08-04 DIAGNOSIS — R269 Unspecified abnormalities of gait and mobility: Secondary | ICD-10-CM | POA: Insufficient documentation

## 2016-08-04 DIAGNOSIS — M6281 Muscle weakness (generalized): Secondary | ICD-10-CM

## 2016-08-04 DIAGNOSIS — M545 Low back pain: Secondary | ICD-10-CM | POA: Diagnosis present

## 2016-08-04 DIAGNOSIS — G8929 Other chronic pain: Secondary | ICD-10-CM | POA: Diagnosis present

## 2016-08-04 NOTE — Therapy (Signed)
[facility] MAIN [facility] [address] [place], [place], [address] Phone: [phone]   Fax:  [phone]  Physical Therapy Evaluation  Patient Details  Name: Debbie [name] MRN: [id number] Date of Birth: [birth year] Referring Provider: Theora MasterZachary Potter, MD  Encounter Date: 08/04/2016      PT End of Session - 08/04/16 2216    Visit Number 1   Number of Visits 12   Date for PT Re-Evaluation 09/15/16   Authorization - Visit Number 1   Authorization - Number of Visits 10   PT Start Time 0930   PT Stop Time 1027   PT Time Calculation (min) 57 min   Activity Tolerance Patient tolerated treatment well   Behavior During Therapy [facility] for tasks assessed/performed      Past Medical History:  Diagnosis Date  . CAD (coronary artery disease)   . COPD (chronic obstructive pulmonary disease) (HCC)   . Diabetes mellitus   . GERD (gastroesophageal reflux disease)   . Hypertension   . 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man [facility]     Past Surgical History:  Procedure Laterality Date  . ABDOMINAL HYSTERECTOMY    . CARDIAC CATHETERIZATION N/A 09/12/2014   Procedure: Left Heart Cath and Coronary Angiography;  Surgeon: Lyn RecordsHenry W Smith, MD;  [facility];  Service: Cardiovascular;  Laterality: N/A;  . CARDIAC CATHETERIZATION N/A 09/13/2014   Procedure: Coronary Stent Intervention;  Surgeon: Kathleene Hazelhristopher D McAlhany, MD;  Location: [facility] INVASIVE CV LAB;  Service: Cardiovascular;  Laterality: N/A;  . CORONARY ARTERY BYPASS GRAFT  10/28/2013   CABG x4 using LIMA to LAD, SVG to ramus, SVG to OM, SVG to RCA, EVH via left thigh and leg - Dr. Linna DarnerPeter Ellman    There were no vitals filed for this visit.       Subjective Assessment - 08/04/16 0937    Subjective Pt. is a pleasant 66 year old female who presents to therapy for gait deviations  secondary to diabetic neuropathy.   Pertinent History Pt. is a pleasant 66 year old female who presents to her physical therapy evaluation for diabetic neuropathy. Pt. had a triple bypass surgery approximately 10 years ago and stents placed 3 years ago.  Her pain in her low back and neurological symptoms down her legs (right in particular) began approximately around the time of the bypass surgery, just slightly prior according to the patient. She can only walk 1/5 of a mile (the distance to her mailbox) and requires frequent rest breaks due to fatigue. Pt. often falls asleep and states that naps are the only thing that helps with her pain and symptoms.    Limitations Walking;Reading;Lifting;Standing;Writing;House hold activities   How long can you sit comfortably? 5-10 minutes   How long can you stand comfortably? 1-2 minutes   How long can you walk comfortably? 1/5th mile   Patient Stated Goals want to be able to walk again, want to play with grandchildren   Currently in Pain? Yes   Pain Score 5    Pain Location Back   Pain Orientation Lower   Pain Descriptors / Indicators Cramping   Pain Type Chronic pain   Pain Radiating Towards right hip   Pain Onset More than a month ago   Pain Frequency Intermittent   Aggravating Factors  activity  Pain Relieving Factors relaxing, taking a nap   Effect of Pain on Daily Activities limits daily life            [facility] PT Assessment - 08/04/16 0001      Assessment   Medical Diagnosis Diabetic Neuropathy   Referring Provider Theora Master, MD   Onset Date/Surgical Date 08/04/12   Hand Dominance Right   Next MD Visit july 18th   Prior Therapy yes, but not for this problem     Precautions   Precautions None     Restrictions   Weight Bearing Restrictions No     Balance Screen   Has the patient fallen in the past 6 months No   Has the patient had a decrease in activity level because of a fear of falling?  Yes   Is the patient reluctant to  leave their home because of a fear of falling?  Yes     Home Environment   Living Environment Private residence   Living Arrangements Spouse/significant other   Available Help at Discharge Family   Type of Home House   Home Access Stairs to enter   Entrance Stairs-Number of Steps 3   Entrance Stairs-Rails None   Home Layout One level   Home Equipment Grab bars - toilet;Grab bars - tub/shower     Prior Function   Level of Independence Independent   Vocation Retired   Leisure camping in Physiological scientist, Research scientist (medical), word games, play with family     Cognition   Overall Cognitive Status Within Functional Limits for tasks assessed     Standardized Balance Assessment   Standardized Balance Assessment Berg Balance Test     Berg Balance Test   Sit to Stand Able to stand without using hands and stabilize independently   Standing Unsupported Able to stand safely 2 minutes   Sitting with Back Unsupported but Feet Supported on Floor or Stool Able to sit safely and securely 2 minutes   Stand to Sit Sits safely with minimal use of hands   Transfers Able to transfer safely, minor use of hands   Standing Unsupported with Eyes Closed Able to stand 10 seconds safely   Standing Ubsupported with Feet Together Able to place feet together independently and stand 1 minute safely   From Standing, Reach Forward with Outstretched Arm Can reach forward >12 cm safely (5")   From Standing Position, Pick up Object from Floor Able to pick up shoe, needs supervision   From Standing Position, Turn to Look Behind Over each Shoulder Looks behind from both sides and weight shifts well   Turn 360 Degrees Able to turn 360 degrees safely but slowly   Standing Unsupported, Alternately Place Feet on Step/Stool Able to stand independently and complete 8 steps >20 seconds   Standing Unsupported, One Foot in Front Able to plae foot ahead of the other independently and hold 30 seconds   Standing on One Leg Able to lift leg  independently and hold > 10 seconds   Total Score 50       PAIN: Tingling and numbness down right leg Aching pain in low back and right hip POSTURE: Forward trunk flexion with rounded shoulders  PROM/AROM: Hamstring: R 44 degrees SLR,          L 35 degrees SLR: painful into back  L and R AP hip mobilizations painful Limited hamstring, popliteal angle, IR, ER  Trunk flexion: get up gower sign, limited mobility + pain   STRENGTH:  Graded on a 0-5 scale Muscle Group Left Right  Hip Flex 4-/5 4/5  Hip Abd 3+/5 4-/5  Hip Add 4/5 4+/5  Knee Flex 4-/5 4+/5  Knee Ext 3+/5 4/5  Ankle DF 3+/5 4-/5  Ankle PF 3+5 4-/5   SENSATION: NORMAL sensation  SPECIAL TESTS: + FABER + SCOUR +GOWER LEFS= 31/80 ABC Scale=67.5% FUNCTIONAL MOBILITY:  Laying on stomach painful  : 8 seconds  BALANCE: BERG 50/56  GAIT: Gait deviation: decreased step length bilaterally, foot flat contact RLE, decreased forward momentum, decreased hip extension bilaterally.  OUTCOME MEASURES: TEST Outcome Interpretation  5 times sit<>stand 16 sec >60 yo, >15 sec indicates increased risk for falls  10 meter walk test      1.25           m/s <1.0 m/s indicates increased risk for falls; limited community ambulator  [employer] Assessment 50/56 <36/56 (100% risk for falls), 37-45 (80% risk for falls); 46-51 (>50% risk for falls); 52-55 (lower risk <25% of falls)     Neuro Re-ed Ankle pumps Sciatic nerve glides seated 10x seated hamstring stretch 1x60 seconds Seated clamshells 10x   Objective measurements completed on examination: See above findings.            PT Education - 08/04/16 2216    Education provided Yes   Education Details HEP, importance of compliance, checking feet daily   Person(s) Educated Patient   Methods Explanation;Demonstration;Handout   Comprehension Verbalized understanding;Returned demonstration             PT Long Term Goals - 08/04/16 2232      PT  LONG TERM GOAL #1   Title Patient (> 29 years old) will complete five times sit to stand test in < 15 seconds indicating an increased LE strength and improved balance.   Baseline 6/18: 16   Time 6   Period Weeks   Status New     PT LONG TERM GOAL #2   Title Patient will increase Berg Balance score to 54/56 to demonstrate decreased fall risk during functional activities.   Baseline 6/18: 50/56   Time 6   Period Weeks   Status New     PT LONG TERM GOAL #3   Title Patient will increase ABC scale score >80% to demonstrate better functional mobility and better confidence with ADLs.    Baseline 6/18: 67.5%   Time 6   Period Weeks   Status New     PT LONG TERM GOAL #4   Title Patient will increase lower extremity functional scale to >60/80 to demonstrate improved functional mobility and increased tolerance with ADLs   Baseline 6/18: 31/80   Time 6   Period Weeks   Status New                Plan - 08/04/16 2214    Clinical Impression Statement Patient is a pleasant 66 year old female who presents to her physical therapy evaluation with gait deviations and balance deficits secondary to diabetic neuropathy. Pt. Has neurological symptoms radiating to her right foot and low back pain with a right hip focus. LLE is significantly weaker than RLE and pt. Is unable to return to upright position from forward flexion without utilizing the Gower maneuver. Balance is challenging to pt. Berg =50/56, LEFS=31/80, ABC=67.5%. 10 m walk test functional speed however gait mechanics were deviated. Gait deviation: decreased step length bilaterally, foot flat contact RLE, decreased forward momentum, decreased hip extension bilaterally. 5x STS indicates risk  for falls (16 seconds). Patient will benefit from skilled physical therapy services to improve mobility and decrease pain for return to previous functional level and increased quality of life.    History and Personal Factors relevant to plan of care:  Achilles tendinitis, hx of cardiac surgery, anxiety, depression, uncontrolled diabetes, high blood pressure, heart disease, angina/chest pain   Clinical Presentation Evolving   Clinical Presentation due to: This patient presents with 3, personal factors/ comorbidities and 3 , body elements including body structures and functions, activity limitations and or participation restrictions. Patient's condition is evolving.    Clinical Decision Making Moderate   Rehab Potential Fair   Clinical Impairments Affecting Rehab Potential uncontrolled diabetes, chronicity   PT Frequency 2x / week   PT Duration 6 weeks   PT Treatment/Interventions ADLs/Self Care Home Management;Moist Heat;Traction;Iontophoresis 4mg /ml Dexamethasone;Electrical Stimulation;Cryotherapy;Ultrasound;DME Instruction;Gait training;Stair training;Balance training;Therapeutic exercise;Therapeutic activities;Functional mobility training;Neuromuscular re-education;Patient/family education;Manual techniques;Compression bandaging;Passive range of motion;Dry needling;Taping;Splinting;Energy conservation;Visual/perceptual remediation/compensation   PT Next Visit Plan review HEP, balance, neural glides   PT Home Exercise Plan see sheet   Consulted and Agree with Plan of Care Patient      Patient will benefit from skilled therapeutic intervention in order to improve the following deficits and impairments:  Abnormal gait, Cardiopulmonary status limiting activity, Decreased activity tolerance, Decreased balance, Decreased endurance, Decreased range of motion, Decreased mobility, Decreased strength, Difficulty walking, Hypomobility, Increased edema, Impaired flexibility, Impaired perceived functional ability, Postural dysfunction, Improper body mechanics, Pain  Visit Diagnosis: Unspecified abnormalities of gait and mobility  Chronic bilateral low back pain, with sciatica presence unspecified  Muscle weakness (generalized)      G-Codes -  09-03-2016 2235/07/26    Functional Assessment Tool Used (Outpatient Only) LEFS, ABC, , 5xSTS, BERG,clinical judgement   Functional Limitation Mobility: Walking and moving around   Mobility: Walking and Moving Around Current Status [phone]) At least 60 percent but less than 80 percent impaired, limited or restricted   Mobility: Walking and Moving Around Goal Status [phone]) At least 20 percent but less than 40 percent impaired, limited or restricted       Problem List Patient Active Problem List   Diagnosis Date Noted  . Abnormal EKG 09/21/2014  . Dyslipidemia 09/11/2014  . Elevated troponin 09/11/2014  . Unstable angina (HCC)   . DM2 (diabetes mellitus, type 2) (HCC) 09/09/2014  . CAD -S/P SVG-Int and RCA DES 09/13/14 09/09/2014  . COPD (chronic obstructive pulmonary disease) (HCC) 09/09/2014  . Benign essential HTN 09/09/2014  . GERD (gastroesophageal reflux disease) 09/09/2014  . S/P CABG x 4- Sept 2015 10/28/2013   Precious Bard, PT, DPT   2016/09/03, 10:39 PM  [facility] MAIN [facility] SERVICES [address] [place], [place], [address] Phone: [phone]   Fax:  [phone]  Name: Debbie [name] MRN: [id number] Date of Birth: [birth year]

## 2016-08-06 ENCOUNTER — Ambulatory Visit: Payer: Medicare Other

## 2016-08-06 VITALS — BP 155/53 | HR 56

## 2016-08-06 DIAGNOSIS — R269 Unspecified abnormalities of gait and mobility: Secondary | ICD-10-CM | POA: Diagnosis not present

## 2016-08-06 DIAGNOSIS — M545 Low back pain: Secondary | ICD-10-CM

## 2016-08-06 DIAGNOSIS — G8929 Other chronic pain: Secondary | ICD-10-CM

## 2016-08-06 DIAGNOSIS — M6281 Muscle weakness (generalized): Secondary | ICD-10-CM

## 2016-08-06 NOTE — Therapy (Signed)
[facility] MAIN [facility] SERVICES [address] [place], [place], [address] Phone: [phone]   Fax:  [phone]  Physical Therapy Treatment  Patient Details  Name: Debbie [name] MRN: [id number] Date of Birth: 1950-06-23 Referring Provider: Theora Master, MD  Encounter Date: 08/06/2016      PT End of Session - 08/06/16 1022    Visit Number 2   Number of Visits 12   Date for PT Re-Evaluation 09/15/16   Authorization - Visit Number 2   Authorization - Number of Visits 10   PT Start Time 0929   PT Stop Time 1015   PT Time Calculation (min) 46 min   Equipment Utilized During Treatment Gait belt   Activity Tolerance Patient tolerated treatment well;Treatment limited secondary to medical complications (Comment)   Behavior During Therapy [facility] for tasks assessed/performed      Past Medical History:  Diagnosis Date  . CAD (coronary artery disease)   . COPD (chronic obstructive pulmonary disease) (HCC)   . Diabetes mellitus   . GERD (gastroesophageal reflux disease)   . Hypertension   . 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 Linna Darner at [facility]     Past Surgical History:  Procedure Laterality Date  . ABDOMINAL HYSTERECTOMY    . CARDIAC CATHETERIZATION N/A 09/12/2014   Procedure: Left Heart Cath and Coronary Angiography;  Surgeon: Lyn Records, MD;  Location: [facility] INVASIVE CV LAB;  Service: Cardiovascular;  Laterality: N/A;  . CARDIAC CATHETERIZATION N/A 09/13/2014   Procedure: Coronary Stent Intervention;  Surgeon: Kathleene Hazel, MD;  Location: [facility] INVASIVE CV LAB;  Service: Cardiovascular;  Laterality: N/A;  . CORONARY ARTERY BYPASS GRAFT  10/28/2013   CABG x4 using LIMA to LAD, SVG to ramus, SVG to OM, SVG to RCA, EVH via left thigh and leg - Dr. Linna Darner    Vitals:   08/06/16 0929  BP: (!) 155/53  Pulse: (!) 56  SpO2: 99%         Subjective Assessment - 08/06/16 1018    Subjective Pt. presents to therapy with exacerbation of COPD, reports she has been compliant with HEP. Has not been walking to mailbox due to the heat.    Pertinent History Pt. is a pleasant 66 year old female who presents to her physical therapy evaluation for diabetic neuropathy. Pt. had a triple bypass surgery approximately 10 years ago and stents placed 3 years ago.  Her pain in her low back and neurological symptoms down her legs (right in particular) began approximately around the time of the bypass surgery, just slightly prior according to the patient. She can only walk 1/5 of a mile (the distance to her mailbox) and requires frequent rest breaks due to fatigue. Pt. often falls asleep and states that naps are the only thing that helps with her pain and symptoms.    Limitations Walking;Reading;Lifting;Standing;Writing;House hold activities   How long can you sit comfortably? 5-10 minutes   How long can you stand comfortably? 1-2 minutes   How long can you walk comfortably? 1/5th mile   Patient Stated Goals want to be able to walk again, want to play with grandchildren   Currently in Pain? Yes   Pain Score 2    Pain Location Back   Pain Orientation Lower   Pain Descriptors / Indicators Aching   Pain Type Chronic  pain   Pain Radiating Towards right hip   Pain Onset More than a month ago      TherEx TA contraction 10x5 seconds Ambulate 60 ft circle x8 with breaks to perform exercises between laps Side stepping in // bars with RTB 6x Side stepping in // bars with side squats 6x Standing marches in // bars 20x  Neuro Re-ed Airex mat balance 1x60 seconds, eyes closed 1x60 seconds CGA, posterior lean/sway Airex pad step up onto 6" step and down 15x no UE assist Bosu ball balance: CGA trunk sway 1x60 seconds Bosu ball balance with UE reaches to add dynamic component 1x60 seconds, increased sway Bosu ball lunges 12x each leg   Manual-semi  reclined position Hamstring stretch hold 2x60 seconds with df overpressure  PROM holds IR, ER, hamstring, df overpessure Neural glides SLR Hip distractions SAD,LAD with belt 6x30 seconds   Pt. response to medical necessity:  Patient will benefit from skilled physical therapy services to improve mobility and decrease pain for return to previous functional level and increase quality of life.         PT Education - 08/06/16 1022    Education provided Yes   Education Details purse lipped breathing   Person(s) Educated Patient   Methods Explanation;Demonstration   Comprehension Verbalized understanding;Returned demonstration;Verbal cues required             PT Long Term Goals - 08/04/16 2232      PT LONG TERM GOAL #1   Title Patient (> 66 years old) will complete five times sit to stand test in < 15 seconds indicating an increased LE strength and improved balance.   Baseline 6/18: 16   Time 6   Period Weeks   Status New     PT LONG TERM GOAL #2   Title Patient will increase Berg Balance score to 54/56 to demonstrate decreased fall risk during functional activities.   Baseline 6/18: 50/56   Time 6   Period Weeks   Status New     PT LONG TERM GOAL #3   Title Patient will increase ABC scale score >80% to demonstrate better functional mobility and better confidence with ADLs.    Baseline 6/18: 67.5%   Time 6   Period Weeks   Status New     PT LONG TERM GOAL #4   Title Patient will increase lower extremity functional scale to >60/80 to demonstrate improved functional mobility and increased tolerance with ADLs   Baseline 6/18: 31/80   Time 6   Period Weeks   Status New               Plan - 08/06/16 1025    Clinical Impression Statement Patient presents to therapy with COPD exacerbation resulting in difficulty breathing. Pt. Positioned in semi reclined position due to COPD. Frequent rest breaks required for breathing. Purse lipped breathing educated on and  performed throughout session. Patient will benefit from skilled physical therapy services to improve mobility and decrease pain for return to previous functional level and increase quality of life.    Rehab Potential Fair   Clinical Impairments Affecting Rehab Potential uncontrolled diabetes, chronicity   PT Frequency 2x / week   PT Duration 6 weeks   PT Treatment/Interventions ADLs/Self Care Home Management;Moist Heat;Traction;Iontophoresis 4mg /ml Dexamethasone;Electrical Stimulation;Cryotherapy;Ultrasound;DME Instruction;Gait training;Stair training;Balance training;Therapeutic exercise;Therapeutic activities;Functional mobility training;Neuromuscular re-education;Patient/family education;Manual techniques;Compression bandaging;Passive range of motion;Dry needling;Taping;Splinting;Energy conservation;Visual/perceptual remediation/compensation   PT Next Visit Plan neural glides, balance   PT Home Exercise Plan see  sheet   Consulted and Agree with Plan of Care Patient      Patient will benefit from skilled therapeutic intervention in order to improve the following deficits and impairments:  Abnormal gait, Cardiopulmonary status limiting activity, Decreased activity tolerance, Decreased balance, Decreased endurance, Decreased range of motion, Decreased mobility, Decreased strength, Difficulty walking, Hypomobility, Increased edema, Impaired flexibility, Impaired perceived functional ability, Postural dysfunction, Improper body mechanics, Pain  Visit Diagnosis: Unspecified abnormalities of gait and mobility  Chronic bilateral low back pain, with sciatica presence unspecified  Muscle weakness (generalized)     Problem List Patient Active Problem List   Diagnosis Date Noted  . Abnormal EKG 09/21/2014  . Dyslipidemia 09/11/2014  . Elevated troponin 09/11/2014  . Unstable angina (HCC)   . DM2 (diabetes mellitus, type 2) (HCC) 09/09/2014  . CAD -S/P SVG-Int and RCA DES 09/13/14 09/09/2014  .  COPD (chronic obstructive pulmonary disease) (HCC) 09/09/2014  . Benign essential HTN 09/09/2014  . GERD (gastroesophageal reflux disease) 09/09/2014  . S/P CABG x 4- Sept 2015 10/28/2013   Precious Bard, PT, DPT   08/06/2016, 10:26 AM  [facility] [facility] MAIN [facility] SERVICES [address] [place], [place], [address] Phone: [phone]   Fax:  [phone]  Name: BEXLEIGH THERIAULT MRN: [id number] Date of Birth: 1950/03/16

## 2016-08-12 ENCOUNTER — Ambulatory Visit: Payer: Medicare Other

## 2016-08-12 DIAGNOSIS — M6281 Muscle weakness (generalized): Secondary | ICD-10-CM

## 2016-08-12 DIAGNOSIS — G8929 Other chronic pain: Secondary | ICD-10-CM

## 2016-08-12 DIAGNOSIS — M545 Low back pain: Secondary | ICD-10-CM

## 2016-08-12 DIAGNOSIS — R269 Unspecified abnormalities of gait and mobility: Secondary | ICD-10-CM | POA: Diagnosis not present

## 2016-08-12 NOTE — Therapy (Signed)
[facility] [facility] [address] [place], [place], [address] Phone: [phone]   Fax:  [phone]  Physical Therapy Treatment  Patient Details  Name: Debbie DaftWilda B Alter MRN: [id number] Date of Birth: [birth year] Referring Provider: Theora MasterZachary Potter, MD  Encounter Date: 08/12/2016      PT End of Session - 08/12/16 0850    Visit Number 3   Number of Visits 12   Date for PT Re-Evaluation 09/15/16   Authorization - Visit Number 3   Authorization - Number of Visits 10   PT Start Time 0759   PT Stop Time 0845   PT Time Calculation (min) 46 min   Equipment Utilized During Treatment Gait belt   Activity Tolerance Patient tolerated treatment well;Treatment limited secondary to medical complications (Comment)   Behavior During Therapy [facility] for tasks assessed/performed      Past Medical History:  Diagnosis Date  . CAD (coronary artery disease)   . COPD (chronic obstructive pulmonary disease) (HCC)   . Diabetes mellitus   . GERD (gastroesophageal reflux disease)   . Hypertension   . 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     Past Surgical History:  Procedure Laterality Date  . ABDOMINAL HYSTERECTOMY    . CARDIAC CATHETERIZATION N/A 09/12/2014   Procedure: Left Heart Cath and Coronary Angiography;  Surgeon: Lyn RecordsHenry W Smith, MD;  [facility];  Service: Cardiovascular;  Laterality: N/A;  . CARDIAC CATHETERIZATION N/A 09/13/2014   Procedure: Coronary Stent Intervention;  Surgeon: Kathleene Hazelhristopher D McAlhany, MD;  Location: [facility] INVASIVE CV LAB;  Service: Cardiovascular;  Laterality: N/A;  . CORONARY ARTERY BYPASS GRAFT  10/28/2013   CABG x4 using LIMA to LAD, SVG to ramus, SVG to OM, SVG to RCA, EVH via left thigh and leg - Dr. Linna DarnerPeter Ellman    There were no vitals filed for this visit.      Subjective Assessment -  08/12/16 0802    Subjective Pt. had some breathing problems since last session and had to use inhaler. Pt. is having overuse fatigue today in right foot   Limitations Walking;Reading;Lifting;Standing;Writing;House hold activities   How long can you sit comfortably? 5-10 minutes   How long can you stand comfortably? 1-2 minutes   How long can you walk comfortably? 1/5th mile   Patient Stated Goals want to be able to walk again, want to play with grandchildren   Currently in Pain? Yes   Pain Score 2    Pain Location Foot   Pain Onset More than a month ago     TA contraction 10x5 seconds Ambulate 60 ft circle x8 with breaks to perform exercises between laps Side stepping in // bars with RTB 6x Side dip squats 10x   Neuro Re-ed Airex mat balance 1x60 seconds, eyes closed 1x60 seconds CGA, posterior lean/sway Airex pad weight shift/heel lift x 15 airex pad marching x 20 Bosu ball balance: CGA trunk sway 1x60 seconds Bosu ball lunges 12x each leg Tilt board forward, backwards 15x     Manual-semi reclined position Hamstring stretch hold 2x60 seconds with df overpressure  PROM holds IR, ER, hamstring, df overpessure Neural glides SLR Hip distractions SAD,LAD with belt 6x30 seconds Ankle AP glides hypomobile.  MTP mobilizations grade I-II hypomobile    Pt. response to medical necessity:  Patient will  benefit from skilled physical therapy services to improve mobility and decrease pain for return to previous functional level and increase quality of life           PT Long Term Goals - 08/04/16 2232      PT LONG TERM GOAL #1   Title Patient (> 66 years old) will complete five times sit to stand test in < 15 seconds indicating an increased LE strength and improved balance.   Baseline 6/18: 16   Time 6   Period Weeks   Status New     PT LONG TERM GOAL #2   Title Patient will increase Berg Balance score to 54/56 to demonstrate decreased fall risk during functional activities.    Baseline 6/18: 50/56   Time 6   Period Weeks   Status New     PT LONG TERM GOAL #3   Title Patient will increase ABC scale score >80% to demonstrate better functional mobility and better confidence with ADLs.    Baseline 6/18: 67.5%   Time 6   Period Weeks   Status New     PT LONG TERM GOAL #4   Title Patient will increase lower extremity functional scale to >60/80 to demonstrate improved functional mobility and increased tolerance with ADLs   Baseline 6/18: 31/80   Time 6   Period Weeks   Status New               Plan - 08/12/16 4098    Clinical Impression Statement Patient presented with better control of breathing allowing for progression of balance and therex with less frequent rest breaks. Limited hip mobility due to tight hamstrings and hip flexors noted and improved with long holds. Foot pain relieved with mobilization. Patient will benefit from skilled physical therapy services to improve mobility and decrease pain for return to previous functional level and increase quality of life    Rehab Potential Fair   Clinical Impairments Affecting Rehab Potential uncontrolled diabetes, chronicity   PT Frequency 2x / week   PT Duration 6 weeks   PT Treatment/Interventions ADLs/Self Care Home Management;Moist Heat;Traction;Iontophoresis 4mg /ml Dexamethasone;Electrical Stimulation;Cryotherapy;Ultrasound;DME Instruction;Gait training;Stair training;Balance training;Therapeutic exercise;Therapeutic activities;Functional mobility training;Neuromuscular re-education;Patient/family education;Manual techniques;Compression bandaging;Passive range of motion;Dry needling;Taping;Splinting;Energy conservation;Visual/perceptual remediation/compensation   PT Next Visit Plan neural glides, balance   PT Home Exercise Plan see sheet   Consulted and Agree with Plan of Care Patient      Patient will benefit from skilled therapeutic intervention in order to improve the following deficits and  impairments:  Abnormal gait, Cardiopulmonary status limiting activity, Decreased activity tolerance, Decreased balance, Decreased endurance, Decreased range of motion, Decreased mobility, Decreased strength, Difficulty walking, Hypomobility, Increased edema, Impaired flexibility, Impaired perceived functional ability, Postural dysfunction, Improper body mechanics, Pain  Visit Diagnosis: Unspecified abnormalities of gait and mobility  Chronic bilateral low back pain, with sciatica presence unspecified  Muscle weakness (generalized)     Problem List Patient Active Problem List   Diagnosis Date Noted  . Abnormal EKG 09/21/2014  . Dyslipidemia 09/11/2014  . Elevated troponin 09/11/2014  . Unstable angina (HCC)   . DM2 (diabetes mellitus, type 2) (HCC) 09/09/2014  . CAD -S/P SVG-Int and RCA DES 09/13/14 09/09/2014  . COPD (chronic obstructive pulmonary disease) (HCC) 09/09/2014  . Benign essential HTN 09/09/2014  . GERD (gastroesophageal reflux disease) 09/09/2014  . 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 [place], [place], [address] Phone: [phone]   Fax:  [phone]  Name: KAELYNN IGO MRN: [id number] Date of Birth: 1951/01/17

## 2016-08-14 ENCOUNTER — Ambulatory Visit: Payer: Medicare Other

## 2016-08-14 DIAGNOSIS — R269 Unspecified abnormalities of gait and mobility: Secondary | ICD-10-CM

## 2016-08-14 DIAGNOSIS — M545 Low back pain: Secondary | ICD-10-CM

## 2016-08-14 DIAGNOSIS — M6281 Muscle weakness (generalized): Secondary | ICD-10-CM

## 2016-08-14 DIAGNOSIS — G8929 Other chronic pain: Secondary | ICD-10-CM

## 2016-08-14 NOTE — Therapy (Signed)
[facility] [address] [place], [place], [address] Phone: [phone]   Fax:  [phone]  Physical Therapy Treatment  Patient Details  Name: Debbie [name] MRN: [id number] Date of Birth: 1950-11-22 Referring Provider: Theora MasterZachary Potter, MD  Encounter Date: 08/14/2016      PT End of Session - 08/14/16 0848    Visit Number 4   Number of Visits 12   Date for PT Re-Evaluation 09/15/16   Authorization - Visit Number 4   Authorization - Number of Visits 10   PT Start Time 0759   PT Stop Time 0846   PT Time Calculation (min) 47 min   Equipment Utilized During Treatment Gait belt   Activity Tolerance Patient tolerated treatment well;Treatment limited secondary to medical complications (Comment)   Behavior [facility] for tasks assessed/performed      Past Medical History:  Diagnosis Date  . CAD (coronary artery disease)   . COPD (chronic obstructive pulmonary disease) (HCC)   . Diabetes mellitus   . GERD (gastroesophageal reflux disease)   . Hypertension   . 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     Past Surgical History:  Procedure Laterality Date  . ABDOMINAL HYSTERECTOMY    . CARDIAC CATHETERIZATION N/A 09/12/2014   Procedure: Left Heart Cath and Coronary Angiography;  Surgeon: Lyn RecordsHenry W Smith, MD;  [facility];  Service: Cardiovascular;  Laterality: N/A;  . CARDIAC CATHETERIZATION N/A 09/13/2014   Procedure: Coronary Stent Intervention;  Surgeon: Kathleene Hazelhristopher D McAlhany, MD;  Location: [facility] INVASIVE CV LAB;  Service: Cardiovascular;  Laterality: N/A;  . CORONARY ARTERY BYPASS GRAFT  10/28/2013   CABG x4 using LIMA to LAD, SVG to ramus, SVG to OM, SVG to RCA, EVH via left thigh and leg - Dr. Linna DarnerPeter Ellman    There were no vitals filed for this visit.      Subjective Assessment -  08/14/16 0801    Subjective Pt. walked to camper and back yesterday. has slight soreness but no outright pain.    Limitations Walking;Reading;Lifting;Standing;Writing;House hold activities   How long can you sit comfortably? 5-10 minutes   How long can you stand comfortably? 1-2 minutes   How long can you walk comfortably? 1/5th mile   Patient Stated Goals want to be able to walk again, want to play with grandchildren   Currently in Pain? No/denies   Pain Onset More than a month ago        Neuro Re-ed Airex pad tandem stance 2x 60 seconds Bosu ball balance: CGA trunk sway 1x60 seconds Bosu ball lunges 12x each leg Stairs 4x reciprical pattern no UE assist Eccentric step down at stairs- too high , 6" step too high, 4" step too high, 2" step worked, 20x Lunges in // bars 4x lengths of bar  nustep 3 minutes level 3 seat 5     Manual-semi reclined position Hamstring stretch hold 2x60 seconds with df overpressure  PROM holds IR, ER, hamstring, df overpessure 60 second holds Neural glides SLR Hip distractions SAD,LAD with belt 6x30 seconds Hip AP glides 2x30 second pulses     Pt. response to medical necessity:  Patient will benefit from skilled physical therapy services to improve mobility and decrease pain for return to previous functional level and increase quality of life  PT Long Term Goals - 08/04/16 2232      PT LONG TERM GOAL #1   Title Patient (> 66 years old) will complete five times sit to stand test in < 15 seconds indicating an increased LE strength and improved balance.   Baseline 6/18: 16   Time 6   Period Weeks   Status New     PT LONG TERM GOAL #2   Title Patient will increase Berg Balance score to 54/56 to demonstrate decreased fall risk during functional activities.   Baseline 6/18: 50/56   Time 6   Period Weeks   Status New     PT LONG TERM GOAL #3   Title Patient will increase ABC scale score >80% to demonstrate better functional mobility and  better confidence with ADLs.    Baseline 6/18: 67.5%   Time 6   Period Weeks   Status New     PT LONG TERM GOAL #4   Title Patient will increase lower extremity functional scale to >60/80 to demonstrate improved functional mobility and increased tolerance with ADLs   Baseline 6/18: 31/80   Time 6   Period Weeks   Status New               Plan - 08/14/16 1610    Clinical Impression Statement Patient demonstrated muscle fatigue throughout session and was unable to perform eccentric step downs off normal height, only 2 inches were able to be performed. Limited hip mobility improved with PROM.  Patient will benefit from skilled physical therapy services to improve mobility and decrease pain for return to previous functional level and increase quality of life   Rehab Potential Fair   Clinical Impairments Affecting Rehab Potential uncontrolled diabetes, chronicity   PT Frequency 2x / week   PT Duration 6 weeks   PT Treatment/Interventions ADLs/Self Care Home Management;Moist Heat;Traction;Iontophoresis 4mg /ml Dexamethasone;Electrical Stimulation;Cryotherapy;Ultrasound;DME Instruction;Gait training;Stair training;Balance training;Therapeutic exercise;Therapeutic activities;Functional mobility training;Neuromuscular re-education;Patient/family education;Manual techniques;Compression bandaging;Passive range of motion;Dry needling;Taping;Splinting;Energy conservation;Visual/perceptual remediation/compensation   PT Next Visit Plan neural glides, balance   PT Home Exercise Plan see sheet   Consulted and Agree with Plan of Care Patient      Patient will benefit from skilled therapeutic intervention in order to improve the following deficits and impairments:  Abnormal gait, Cardiopulmonary status limiting activity, Decreased activity tolerance, Decreased balance, Decreased endurance, Decreased range of motion, Decreased mobility, Decreased strength, Difficulty walking, Hypomobility, Increased  edema, Impaired flexibility, Impaired perceived functional ability, Postural dysfunction, Improper body mechanics, Pain  Visit Diagnosis: Unspecified abnormalities of gait and mobility  Chronic bilateral low back pain, with sciatica presence unspecified  Muscle weakness (generalized)     Problem List Patient Active Problem List   Diagnosis Date Noted  . Abnormal EKG 09/21/2014  . Dyslipidemia 09/11/2014  . Elevated troponin 09/11/2014  . Unstable angina (HCC)   . DM2 (diabetes mellitus, type 2) (HCC) 09/09/2014  . CAD -S/P SVG-Int and RCA DES 09/13/14 09/09/2014  . COPD (chronic obstructive pulmonary disease) (HCC) 09/09/2014  . Benign essential HTN 09/09/2014  . GERD (gastroesophageal reflux disease) 09/09/2014  . S/P CABG x 4- Sept 2015 10/28/2013   Precious Bard, PT, DPT   08/14/2016, 8:53 AM  [facility] MAIN [facility] SERVICES [address] [place], [place], [address] Phone: [phone]   Fax:  [phone]  Name: MAIE KESINGER MRN: [id number] Date of Birth: 1950-10-16

## 2016-08-19 ENCOUNTER — Ambulatory Visit: Payer: Medicare Other | Attending: Neurology

## 2016-08-19 DIAGNOSIS — M545 Low back pain: Secondary | ICD-10-CM | POA: Insufficient documentation

## 2016-08-19 DIAGNOSIS — G8929 Other chronic pain: Secondary | ICD-10-CM | POA: Insufficient documentation

## 2016-08-19 DIAGNOSIS — R269 Unspecified abnormalities of gait and mobility: Secondary | ICD-10-CM | POA: Insufficient documentation

## 2016-08-19 DIAGNOSIS — M6281 Muscle weakness (generalized): Secondary | ICD-10-CM | POA: Insufficient documentation

## 2016-08-19 NOTE — Therapy (Signed)
[place] [facility] [address] [place], [place], [address] Phone: [phone]   Fax:  [phone]  Physical Therapy Treatment  Patient Details  Name: Debbie [name] MRN: [id number] Date of Birth: 1950/08/20 Referring Provider: Theora MasterZachary Potter, MD  Encounter Date: 08/19/2016      PT End of Session - 08/19/16 0911    Visit Number 5   Number of Visits 12   Date for PT Re-Evaluation 09/15/16   Authorization - Visit Number 5   Authorization - Number of Visits 10   PT Start Time 0914   PT Stop Time 1000   PT Time Calculation (min) 46 min   Equipment Utilized During Treatment Gait belt   Activity Tolerance Patient tolerated treatment well;Treatment limited secondary to medical complications (Comment)   Behavior [facility] for tasks assessed/performed      Past Medical History:  Diagnosis Date  . CAD (coronary artery disease)   . COPD (chronic obstructive pulmonary disease) (HCC)   . Diabetes mellitus   . GERD (gastroesophageal reflux disease)   . Hypertension   . 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     Past Surgical History:  Procedure Laterality Date  . ABDOMINAL HYSTERECTOMY    . CARDIAC CATHETERIZATION N/A 09/12/2014   Procedure: Left Heart Cath and Coronary Angiography;  Surgeon: Lyn RecordsHenry W Smith, MD;  [facility];  Service: Cardiovascular;  Laterality: N/A;  . CARDIAC CATHETERIZATION N/A 09/13/2014   Procedure: Coronary Stent Intervention;  Surgeon: Kathleene Hazelhristopher D McAlhany, MD;  Location: [facility] INVASIVE CV LAB;  Service: Cardiovascular;  Laterality: N/A;  . CORONARY ARTERY BYPASS GRAFT  10/28/2013   CABG x4 using LIMA to LAD, SVG to ramus, SVG to OM, SVG to RCA, EVH via left thigh and leg - Dr. Linna DarnerPeter Ellman    There were no vitals filed for this visit.      Subjective Assessment - 08/19/16  0916    Subjective Pt. is watching her granddaughters this week and is fatigued. Increased right LE pain   Limitations Walking;Reading;Lifting;Standing;Writing;House hold activities   How long can you sit comfortably? 5-10 minutes   How long can you stand comfortably? 1-2 minutes   How long can you walk comfortably? 1/5th mile   Patient Stated Goals want to be able to walk again, want to play with grandchildren   Currently in Pain? Yes   Pain Score 7    Pain Location Leg   Pain Orientation Right   Pain Descriptors / Indicators Aching;Shooting   Pain Type Neuropathic pain   Pain Onset More than a month ago       TherEx nustep 3 minutes level 3 seat 5 TA contractions 12x TA contractions with swiss ball x 12 TA contractions with swiss ball and UE raises 12x TA contraction with swiss ball leg marches  TA contraction heel slides 12x Gluteal squeezes 15x Supine clamshells RTB 15x  Supine adduction towel squeeze 15x   Manual-semi reclined position Hamstring stretch hold 2x60 seconds with df overpressure  PROM holds IR, ER, hamstring, df overpessure 60 second holds Neural glides SLR Hip distractions SAD,LAD with belt 6x30 seconds Hip AP glides 2x30 second pulses     Pt. response to medical necessity:  Patient will benefit from skilled physical therapy services to improve mobility and decrease pain  for return to previous functional level and increase quality of life          PT Education - 08/19/16 0910    Education provided Yes   Education Details body mechanics for symptom relief   Person(s) Educated Patient   Methods Explanation;Demonstration   Comprehension Verbalized understanding;Returned demonstration             PT Long Term Goals - 08/04/16 2232      PT LONG TERM GOAL #1   Title Patient (> 55 years old) will complete five times sit to stand test in < 15 seconds indicating an increased LE strength and improved balance.   Baseline 6/18: 16   Time 6    Period Weeks   Status New     PT LONG TERM GOAL #2   Title Patient will increase Berg Balance score to 54/56 to demonstrate decreased fall risk during functional activities.   Baseline 6/18: 50/56   Time 6   Period Weeks   Status New     PT LONG TERM GOAL #3   Title Patient will increase ABC scale score >80% to demonstrate better functional mobility and better confidence with ADLs.    Baseline 6/18: 67.5%   Time 6   Period Weeks   Status New     PT LONG TERM GOAL #4   Title Patient will increase lower extremity functional scale to >60/80 to demonstrate improved functional mobility and increased tolerance with ADLs   Baseline 6/18: 31/80   Time 6   Period Weeks   Status New               Plan - 08/19/16 1001    Clinical Impression Statement Pt. Presented with more fatigue and increased symptoms due to watching granddaughters for the past week. Supine interventions implemented due to pt.'s exacerbations. Manual tx relieved some but not all neurological symptoms down right leg.  Patient will benefit from skilled physical therapy services to improve mobility and decrease pain for return to previous functional level and increase quality of life   Rehab Potential Fair   Clinical Impairments Affecting Rehab Potential uncontrolled diabetes, chronicity   PT Frequency 2x / week   PT Duration 6 weeks   PT Treatment/Interventions ADLs/Self Care Home Management;Moist Heat;Traction;Iontophoresis 4mg /ml Dexamethasone;Electrical Stimulation;Cryotherapy;Ultrasound;DME Instruction;Gait training;Stair training;Balance training;Therapeutic exercise;Therapeutic activities;Functional mobility training;Neuromuscular re-education;Patient/family education;Manual techniques;Compression bandaging;Passive range of motion;Dry needling;Taping;Splinting;Energy conservation;Visual/perceptual remediation/compensation   PT Next Visit Plan neural glides, balance   PT Home Exercise Plan see sheet   Consulted  and Agree with Plan of Care Patient      Patient will benefit from skilled therapeutic intervention in order to improve the following deficits and impairments:  Abnormal gait, Cardiopulmonary status limiting activity, Decreased activity tolerance, Decreased balance, Decreased endurance, Decreased range of motion, Decreased mobility, Decreased strength, Difficulty walking, Hypomobility, Increased edema, Impaired flexibility, Impaired perceived functional ability, Postural dysfunction, Improper body mechanics, Pain  Visit Diagnosis: Unspecified abnormalities of gait and mobility  Chronic bilateral low back pain, with sciatica presence unspecified  Muscle weakness (generalized)     Problem List Patient Active Problem List   Diagnosis Date Noted  . Abnormal EKG 09/21/2014  . Dyslipidemia 09/11/2014  . Elevated troponin 09/11/2014  . Unstable angina (HCC)   . DM2 (diabetes mellitus, type 2) (HCC) 09/09/2014  . CAD -S/P SVG-Int and RCA DES 09/13/14 09/09/2014  . COPD (chronic obstructive pulmonary disease) (HCC) 09/09/2014  . Benign essential HTN 09/09/2014  . GERD (gastroesophageal reflux disease) 09/09/2014  .  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 [place], [place], [address] Phone: [phone]   Fax:  [phone]  Name: Debbie [name] MRN: [id number] Date of Birth: [birth year]

## 2016-08-25 ENCOUNTER — Ambulatory Visit: Payer: 59

## 2016-08-27 ENCOUNTER — Ambulatory Visit: Payer: 59

## 2016-09-02 ENCOUNTER — Ambulatory Visit: Payer: Medicare Other

## 2016-09-02 DIAGNOSIS — M545 Low back pain: Secondary | ICD-10-CM

## 2016-09-02 DIAGNOSIS — R269 Unspecified abnormalities of gait and mobility: Secondary | ICD-10-CM | POA: Diagnosis not present

## 2016-09-02 DIAGNOSIS — M6281 Muscle weakness (generalized): Secondary | ICD-10-CM

## 2016-09-02 DIAGNOSIS — G8929 Other chronic pain: Secondary | ICD-10-CM

## 2016-09-02 NOTE — Therapy (Signed)
[place] [facility] MAIN [facility] SERVICES [address] [place], [place], [address] Phone: [phone]   Fax:  [phone]  Physical Therapy Treatment  Patient Details  Name: Debbie [name] MRN: [id number] Date of Birth: November 13, 1950 Referring Provider: Theora Master, MD  Encounter Date: 09/02/2016      PT End of Session - 09/02/16 0934    Visit Number 6   Number of Visits 12   Date for PT Re-Evaluation 09/15/16   Authorization - Visit Number 6   Authorization - Number of Visits 10   PT Start Time 0845   PT Stop Time 0931   PT Time Calculation (min) 46 min   Equipment Utilized During Treatment Gait belt   Activity Tolerance Patient tolerated treatment well;Treatment limited secondary to medical complications (Comment)   Behavior During Therapy [facility] for tasks assessed/performed      Past Medical History:  Diagnosis Date  . CAD (coronary artery disease)   . COPD (chronic obstructive pulmonary disease) (HCC)   . Diabetes mellitus   . GERD (gastroesophageal reflux disease)   . Hypertension   . 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 Lin[facility]     Past Surgical History:  Procedure Laterality Date  . ABDOMINAL HYSTERECTOMY    . CARDIAC CATHETERIZATION N/A 09/12/2014   Procedure: Left Heart Cath and Coronary Angiography;  Surgeon: Lyn Records, MD;  [facility]: [facility] IN[facility] CV LAB;  Service: Cardiovascular;  Laterality: N/A;  . CARDIAC CATHETERIZATION N/A 09/13/2014   Procedure: Coronary Stent Intervention;  Surgeon: Kathleene Hazel, MD;  Location: [facility] INVASIVE CV LAB;  Service: Cardiovascular;  Laterality: N/A;  . CORONARY ARTERY BYPASS GRAFT  10/28/2013   CABG x4 using LIMA to LAD, SVG to ramus, SVG to OM, SVG to RCA, EVH via left thigh and leg - Dr. Linna Darner    There were no vitals filed for this visit.      Subjective Assessment -  09/02/16 0848    Subjective Pt. is returning from watching her grandchildren for a week. She has slight R groin pain. Was able to walk [place] over the weekend multiple times with occasional rest breaks   Limitations Walking;Reading;Lifting;Standing;Writing;House hold activities   How long can you sit comfortably? 5-10 minutes   How long can you stand comfortably? 1-2 minutes   How long can you walk comfortably? 1/5th mile   Patient Stated Goals want to be able to walk again, want to play with grandchildren   Currently in Pain? Yes   Pain Score 2    Pain Location Groin   Pain Orientation Right   Pain Descriptors / Indicators Aching   Pain Type Chronic pain   Pain Onset More than a month ago      Neuro Re-ed Airex pad tandem stance 2x 60 seconds Bosu ball balance: CGA trunk sway 1x60 seconds Bosu ball lunges 12x each leg x2 Trampoline: heel raises 10x, toe raises 10x, single limb stance, 10x mini squats, 20 small jumps  Backwards walking in // bars 4x      Manual-semi reclined position Hamstring stretch hold 2x60 seconds with df overpressure  PROM holds IR, ER, hamstring, df overpessure 60 second holds Neural glides SLR Hip distractions SAD,LAD with belt 6x30 seconds Hip AP glides 2x30 second pulses  Seated hamstring stretch with band 2x60 seconds  Pt. response to medical necessity:  Patient will benefit from skilled physical therapy services to improve mobility and decrease pain for return to previous functional level and increase quality of life          PT Long Term Goals - 08/04/16 2232      PT LONG TERM GOAL #1   Title Patient (> 66 years old) will complete five times sit to stand test in < 15 seconds indicating an increased LE strength and improved balance.   Baseline 6/18: 16   Time 6   Period Weeks   Status New     PT LONG TERM GOAL #2   Title Patient will increase Berg Balance score to 54/56 to demonstrate decreased fall risk during functional  activities.   Baseline 6/18: 50/56   Time 6   Period Weeks   Status New     PT LONG TERM GOAL #3   Title Patient will increase ABC scale score >80% to demonstrate better functional mobility and better confidence with ADLs.    Baseline 6/18: 67.5%   Time 6   Period Weeks   Status New     PT LONG TERM GOAL #4   Title Patient will increase lower extremity functional scale to >60/80 to demonstrate improved functional mobility and increased tolerance with ADLs   Baseline 6/18: 31/80   Time 6   Period Weeks   Status New               Plan - 09/02/16 0936    Clinical Impression Statement Patient continues to have R groin pain radiating from lumbar region. Progression of capacity for functional aerobic activities is noted with pt. Purchasing trampoline for home workout. Exercises for trampoline reviewed and pt. Demonstrated understanding. Decreased need for seated resting breaks noted throughout session with pt. Performing steady breathing. Patient will benefit from skilled physical therapy services to improve mobility and decrease pain for return to previous functional level and increase quality of life   Rehab Potential Fair   Clinical Impairments Affecting Rehab Potential uncontrolled diabetes, chronicity   PT Frequency 2x / week   PT Duration 6 weeks   PT Treatment/Interventions ADLs/Self Care Home Management;Moist Heat;Traction;Iontophoresis 4mg /ml Dexamethasone;Electrical Stimulation;Cryotherapy;Ultrasound;DME Instruction;Gait training;Stair training;Balance training;Therapeutic exercise;Therapeutic activities;Functional mobility training;Neuromuscular re-education;Patient/family education;Manual techniques;Compression bandaging;Passive range of motion;Dry needling;Taping;Splinting;Energy conservation;Visual/perceptual remediation/compensation   PT Next Visit Plan trampoline, walking   PT Home Exercise Plan see sheet   Consulted and Agree with Plan of Care Patient      Patient  will benefit from skilled therapeutic intervention in order to improve the following deficits and impairments:  Abnormal gait, Cardiopulmonary status limiting activity, Decreased activity tolerance, Decreased balance, Decreased endurance, Decreased range of motion, Decreased mobility, Decreased strength, Difficulty walking, Hypomobility, Increased edema, Impaired flexibility, Impaired perceived functional ability, Postural dysfunction, Improper body mechanics, Pain  Visit Diagnosis: Unspecified abnormalities of gait and mobility  Chronic bilateral low back pain, with sciatica presence unspecified  Muscle weakness (generalized)     Problem List Patient Active Problem List   Diagnosis Date Noted  . Abnormal EKG 09/21/2014  . Dyslipidemia 09/11/2014  . Elevated troponin 09/11/2014  . Unstable angina (HCC)   . DM2 (diabetes mellitus, type 2) (HCC) 09/09/2014  . CAD -S/P SVG-Int and RCA DES 09/13/14 09/09/2014  . COPD (chronic obstructive pulmonary disease) (HCC) 09/09/2014  . Benign essential HTN 09/09/2014  . GERD (gastroesophageal reflux disease) 09/09/2014  . 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

## 2016-09-04 ENCOUNTER — Ambulatory Visit: Payer: 59

## 2016-09-05 ENCOUNTER — Ambulatory Visit: Payer: Medicare Other

## 2016-09-05 DIAGNOSIS — M6281 Muscle weakness (generalized): Secondary | ICD-10-CM

## 2016-09-05 DIAGNOSIS — R269 Unspecified abnormalities of gait and mobility: Secondary | ICD-10-CM

## 2016-09-05 DIAGNOSIS — M545 Low back pain: Secondary | ICD-10-CM

## 2016-09-05 DIAGNOSIS — G8929 Other chronic pain: Secondary | ICD-10-CM

## 2016-09-05 NOTE — Therapy (Signed)
[facility] MAIN [facility] SERVICES [address] [place], [place], [address] Phone: [phone]   Fax:  [phone]  Physical Therapy Treatment  Patient Details  Name: Debbie [name] MRN: [id number] Date of Birth: 06-Oct-1950 Referring Provider: Theora Master, MD  Encounter Date: 09/05/2016      PT End of Session - 09/05/16 0940    Visit Number 7   Number of Visits 12   Date for PT Re-Evaluation 09/15/16   Authorization - Visit Number 7   Authorization - Number of Visits 10   PT Start Time 0845   PT Stop Time 0931   PT Time Calculation (min) 46 min   Equipment Utilized During Treatment Gait belt   Activity Tolerance Patient tolerated treatment well;Patient limited by pain   Behavior During Therapy [facility] for tasks assessed/performed      Past Medical History:  Diagnosis Date  . CAD (coronary artery disease)   . COPD (chronic obstructive pulmonary disease) (HCC)   . Diabetes mellitus   . GERD (gastroesophageal reflux disease)   . Hypertension   . 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 Linna Darner at [facility]     Past Surgical History:  Procedure Laterality Date  . ABDOMINAL HYSTERECTOMY    . CARDIAC CATHETERIZATION N/A 09/12/2014   Procedure: Left Heart Cath and Coronary Angiography;  Surgeon: Lyn Records, MD;  Location: [facility] INVASIVE CV LAB;  Service: Cardiovascular;  Laterality: N/A;  . CARDIAC CATHETERIZATION N/A 09/13/2014   Procedure: Coronary Stent Intervention;  Surgeon: Kathleene Hazel, MD;  Location: [facility] INVASIVE CV LAB;  Service: Cardiovascular;  Laterality: N/A;  . CORONARY ARTERY BYPASS GRAFT  10/28/2013   CABG x4 using LIMA to LAD, SVG to ramus, SVG to OM, SVG to RCA, EVH via left thigh and leg - Dr. Linna Darner    There were no vitals filed for this visit.      Subjective Assessment - 09/05/16 0847    Subjective Patient went to  lay down on grandchildrens trundle bed and couldn't get back up. Had severe pain in low back/pelvis that radiated to right foot. pain better today but was bad tuesday through last night   Limitations Walking;Reading;Lifting;Standing;Writing;House hold activities   How long can you sit comfortably? 5-10 minutes   How long can you stand comfortably? 1-2 minutes   How long can you walk comfortably? 1/5th mile   Patient Stated Goals want to be able to walk again, want to play with grandchildren   Currently in Pain? Yes   Pain Score 3    Pain Location Back   Pain Orientation Right;Lower   Pain Descriptors / Indicators Contraction;Shooting;Pressure   Pain Type Acute pain   Pain Radiating Towards right toe   Pain Onset In the past 7 days   Pain Frequency Constant       TherEx Figure 4 stretch supine  Figure 4 stretch seated Forward thoracic/lumbar flexion seated with Swiss Ball 3 directions  Supine butterfly (abduction) 3x45 seconds      Prone hip flexor stretch Grade I-II CPAs and UPAs, L3 hypomobile and painful on R and C,feel relief with L.  STM R piriformis  Manual-semi reclined position Hamstring stretch hold 2x60 seconds with df overpressure  PROM holds IR, ER, hamstring, df overpessure 60 second holds Neural glides SLR Hip distractions SAD,LAD with belt 6x30 seconds  Hip AP glides 2x30 second pulses  Seated hamstring stretch with band 2x60 seconds    Pt. response to medical necessity:  Patient will benefit from skilled physical therapy services to improve mobility and decrease pain for return to previous functional level and increase quality of life         PT Long Term Goals - 08/04/16 2232      PT LONG TERM GOAL #1   Title Patient (> 8 years old) will complete five times sit to stand test in < 15 seconds indicating an increased LE strength and improved balance.   Baseline 6/18: 16   Time 6   Period Weeks   Status New     PT LONG TERM GOAL #2   Title Patient  will increase Berg Balance score to 54/56 to demonstrate decreased fall risk during functional activities.   Baseline 6/18: 50/56   Time 6   Period Weeks   Status New     PT LONG TERM GOAL #3   Title Patient will increase ABC scale score >80% to demonstrate better functional mobility and better confidence with ADLs.    Baseline 6/18: 67.5%   Time 6   Period Weeks   Status New     PT LONG TERM GOAL #4   Title Patient will increase lower extremity functional scale to >60/80 to demonstrate improved functional mobility and increased tolerance with ADLs   Baseline 6/18: 31/80   Time 6   Period Weeks   Status New               Plan - 09/05/16 0940    Clinical Impression Statement Pt. presents with increased pain of lumbar region from attempting to lay on a trundle bed with grandchildren. Hypomobility of L3 CPA and R UPA noted with pain at grade I-II mobilizations. Grade III mobilizations of L UPA at L3 was pain relieving. Tight R piriformis also noted with sciatic nerve compression symptoms radiating to foot. Stretches introduced and patient demonstrated understanding. Patient will benefit from skilled physical therapy services to improve mobility and decrease pain for return to previous functional level and increase quality of life   Rehab Potential Fair   Clinical Impairments Affecting Rehab Potential uncontrolled diabetes, chronicity   PT Frequency 2x / week   PT Duration 6 weeks   PT Treatment/Interventions ADLs/Self Care Home Management;Moist Heat;Traction;Iontophoresis 4mg /ml Dexamethasone;Electrical Stimulation;Cryotherapy;Ultrasound;DME Instruction;Gait training;Stair training;Balance training;Therapeutic exercise;Therapeutic activities;Functional mobility training;Neuromuscular re-education;Patient/family education;Manual techniques;Compression bandaging;Passive range of motion;Dry needling;Taping;Splinting;Energy conservation;Visual/perceptual remediation/compensation   PT  Next Visit Plan treadmill   PT Home Exercise Plan see sheet   Consulted and Agree with Plan of Care Patient      Patient will benefit from skilled therapeutic intervention in order to improve the following deficits and impairments:  Abnormal gait, Cardiopulmonary status limiting activity, Decreased activity tolerance, Decreased balance, Decreased endurance, Decreased range of motion, Decreased mobility, Decreased strength, Difficulty walking, Hypomobility, Increased edema, Impaired flexibility, Impaired perceived functional ability, Postural dysfunction, Improper body mechanics, Pain  Visit Diagnosis: Unspecified abnormalities of gait and mobility  Chronic bilateral low back pain, with sciatica presence unspecified  Muscle weakness (generalized)     Problem List Patient Active Problem List   Diagnosis Date Noted  . Abnormal EKG 09/21/2014  . Dyslipidemia 09/11/2014  . Elevated troponin 09/11/2014  . Unstable angina (HCC)   . DM2 (diabetes mellitus, type 2) (HCC) 09/09/2014  . CAD -S/P SVG-Int and RCA DES 09/13/14 09/09/2014  . COPD (chronic obstructive pulmonary disease) (HCC) 09/09/2014  .  Benign essential HTN 09/09/2014  . GERD (gastroesophageal reflux disease) 09/09/2014  . 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

## 2016-09-08 ENCOUNTER — Ambulatory Visit: Payer: Medicare Other

## 2016-09-08 DIAGNOSIS — M545 Low back pain: Secondary | ICD-10-CM

## 2016-09-08 DIAGNOSIS — M6281 Muscle weakness (generalized): Secondary | ICD-10-CM

## 2016-09-08 DIAGNOSIS — R269 Unspecified abnormalities of gait and mobility: Secondary | ICD-10-CM | POA: Diagnosis not present

## 2016-09-08 DIAGNOSIS — G8929 Other chronic pain: Secondary | ICD-10-CM

## 2016-09-08 NOTE — Therapy (Signed)
[place] [facility] MAIN [facility] [address] [place], [place], [address] Phone: [phone]   Fax:  [phone]  Physical Therapy Treatment  Patient Details  Name: Debbie [name] MRN: [id number] Date of Birth: 1951-01-13 Referring Provider: Theora MasterZachary Potter, MD  Encounter Date: 09/08/2016      PT End of Session - 09/08/16 1027    Visit Number 8   Number of Visits 12   Date for PT Re-Evaluation 09/15/16   Authorization - Visit Number 8   Authorization - Number of Visits 10   PT Start Time 0930   PT Stop Time 1016   PT Time Calculation (min) 46 min   Equipment Utilized During Treatment Gait belt   Activity Tolerance Patient tolerated treatment well   Behavior [facility] for tasks assessed/performed      Past Medical History:  Diagnosis Date  . CAD (coronary artery disease)   . COPD (chronic obstructive pulmonary disease) (HCC)   . Diabetes mellitus   . GERD (gastroesophageal reflux disease)   . Hypertension   . 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     Past Surgical History:  Procedure Laterality Date  . ABDOMINAL HYSTERECTOMY    . CARDIAC CATHETERIZATION N/A 09/12/2014   Procedure: Left Heart Cath and Coronary Angiography;  Surgeon: Lyn RecordsHenry W Smith, MD;  [facility];  Service: Cardiovascular;  Laterality: N/A;  . CARDIAC CATHETERIZATION N/A 09/13/2014   Procedure: Coronary Stent Intervention;  Surgeon: Kathleene Hazelhristopher D McAlhany, MD;  Location: [facility];  Service: Cardiovascular;  Laterality: N/A;  . CORONARY ARTERY BYPASS GRAFT  10/28/2013   CABG x4 using LIMA to LAD, SVG to ramus, SVG to OM, SVG to RCA, EVH via left thigh and leg - Dr. Linna DarnerPeter Ellman    There were no vitals filed for this visit.      Subjective Assessment - 09/08/16 0932    Subjective Patient worked with her bees over the  weekend.    Limitations Walking;Reading;Lifting;Standing;Writing;House hold activities   How long can you sit comfortably? 5-10 minutes   How long can you stand comfortably? 1-2 minutes   How long can you walk comfortably? 1/5th mile   Patient Stated Goals want to be able to walk again, want to play with grandchildren   Currently in Pain? Yes   Pain Score 2    Pain Location Back   Pain Orientation Right;Lower   Pain Descriptors / Indicators Aching   Pain Type Chronic pain   Pain Onset In the past 7 days      TherEx Figure 4 stretch supine  Forward thoracic/lumbar flexion seated with Swiss Ball 3 directions  Seated hamstring stretch 2x60 seconds Supine butterfly (abduction) 3x45 seconds Walking outside up and down sight incline. Fatigued pt. Upon fatigue pt. Decreased LUE arm swing and trunk rotation with secondary neurological tingling down LLE.       Prone hip flexor stretch Grade I-II CPAs and UPAs, L3 hypomobile and painful on R and C,feel relief with L.  STM R piriformis  Manual-semi reclined position Hamstring stretch hold 2x60 seconds with df overpressure  PROM holds IR, ER, hamstring, df overpessure 60 second holds Neural glides SLR Hip distractions SAD,LAD with belt 6x30 seconds Hip AP glides 2x30 second pulses  Seated hamstring stretch with band 2x60 seconds  Pt. response to medical necessity:  Patient will benefit from skilled physical therapy services to improve mobility and decrease pain for return to previous functional level and increase quality of life           PT Long Term Goals - 08/04/16 2232      PT LONG TERM GOAL #1   Title Patient (> 46 years old) will complete five times sit to stand test in < 15 seconds indicating an increased LE strength and improved balance.   Baseline 6/18: 16   Time 6   Period Weeks   Status New     PT LONG TERM GOAL #2   Title Patient will increase Berg Balance score to 54/56 to demonstrate decreased fall risk  during functional activities.   Baseline 6/18: 50/56   Time 6   Period Weeks   Status New     PT LONG TERM GOAL #3   Title Patient will increase ABC scale score >80% to demonstrate better functional mobility and better confidence with ADLs.    Baseline 6/18: 67.5%   Time 6   Period Weeks   Status New     PT LONG TERM GOAL #4   Title Patient will increase lower extremity functional scale to >60/80 to demonstrate improved functional mobility and increased tolerance with ADLs   Baseline 6/18: 31/80   Time 6   Period Weeks   Status New               Plan - 09/08/16 1029    Clinical Impression Statement Patient has decreased pain from previous week. When fatigued while ambulating patient decreases LUE swing and trunk rotation with resultant LLE neurological symptoms. Symptoms improved with improved rotation Patient will benefit from skilled physical therapy services to improve mobility and decrease pain for return to previous functional level and increase quality of life.    Rehab Potential Fair   Clinical Impairments Affecting Rehab Potential uncontrolled diabetes, chronicity   PT Frequency 2x / week   PT Duration 6 weeks   PT Treatment/Interventions ADLs/Self Care Home Management;Moist Heat;Traction;Iontophoresis 4mg /ml Dexamethasone;Electrical Stimulation;Cryotherapy;Ultrasound;DME Instruction;Gait training;Stair training;Balance training;Therapeutic exercise;Therapeutic activities;Functional mobility training;Neuromuscular re-education;Patient/family education;Manual techniques;Compression bandaging;Passive range of motion;Dry needling;Taping;Splinting;Energy conservation;Visual/perceptual remediation/compensation   PT Next Visit Plan treadmill   PT Home Exercise Plan see sheet   Consulted and Agree with Plan of Care Patient      Patient will benefit from skilled therapeutic intervention in order to improve the following deficits and impairments:  Abnormal gait,  Cardiopulmonary status limiting activity, Decreased activity tolerance, Decreased balance, Decreased endurance, Decreased range of motion, Decreased mobility, Decreased strength, Difficulty walking, Hypomobility, Increased edema, Impaired flexibility, Impaired perceived functional ability, Postural dysfunction, Improper body mechanics, Pain  Visit Diagnosis: Unspecified abnormalities of gait and mobility  Chronic bilateral low back pain, with sciatica presence unspecified  Muscle weakness (generalized)     Problem List Patient Active Problem List   Diagnosis Date Noted  . Abnormal EKG 09/21/2014  . Dyslipidemia 09/11/2014  . Elevated troponin 09/11/2014  . Unstable angina (HCC)   . DM2 (diabetes mellitus, type 2) (HCC) 09/09/2014  . CAD -S/P SVG-Int and RCA DES 09/13/14 09/09/2014  . COPD (chronic obstructive pulmonary disease) (HCC) 09/09/2014  . Benign essential HTN 09/09/2014  . GERD (gastroesophageal reflux disease) 09/09/2014  . S/P CABG x 4- Sept 2015 10/28/2013   Precious Bard, PT, DPT   Precious Bard 09/08/2016, 10:30 AM  [facility] MAIN [facility] SERVICES [address] [place], [place], [address] Phone: [phone]   Fax:  [phone]  Name: GIAVONNI CIZEK MRN: [id number] Date of Birth: 1951-01-15

## 2016-09-10 ENCOUNTER — Ambulatory Visit: Payer: Medicare Other

## 2016-09-10 DIAGNOSIS — G8929 Other chronic pain: Secondary | ICD-10-CM

## 2016-09-10 DIAGNOSIS — M6281 Muscle weakness (generalized): Secondary | ICD-10-CM

## 2016-09-10 DIAGNOSIS — R269 Unspecified abnormalities of gait and mobility: Secondary | ICD-10-CM | POA: Diagnosis not present

## 2016-09-10 DIAGNOSIS — M545 Low back pain: Secondary | ICD-10-CM

## 2016-09-10 NOTE — Therapy (Signed)
[place] MAIN [facility] SERVICES [address] [place], [place], [address] Phone: [phone]   Fax:  [phone]  Physical Therapy Treatment  Patient Details  Name: Debbie [name] MRN: [id number] Date of Birth: 1950/04/21 Referring Provider: Gurney Maxin, MD  Encounter Date: 09/10/2016      PT End of Session - 09/10/16 0902    Visit Number 9   Number of Visits 12   Date for PT Re-Evaluation 09/15/16   Authorization - Visit Number 9   Authorization - Number of Visits 10   PT Start Time 4627   PT Stop Time 0946   PT Time Calculation (min) 48 min   Equipment Utilized During Treatment Gait belt   Activity Tolerance Patient tolerated treatment well   Behavior During Therapy [facility] for tasks assessed/performed      Past Medical History:  Diagnosis Date  . CAD (coronary artery disease)   . COPD (chronic obstructive pulmonary disease) ([place])   . Diabetes mellitus   . GERD (gastroesophageal reflux disease)   . Hypertension   . 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 Veneta Penton at [facility]     Past Surgical History:  Procedure Laterality Date  . ABDOMINAL HYSTERECTOMY    . CARDIAC CATHETERIZATION N/A 09/12/2014   Procedure: Left Heart Cath and Coronary Angiography;  Surgeon: Belva Crome, MD;  Location: [place] CV LAB;  Service: Cardiovascular;  Laterality: N/A;  . CARDIAC CATHETERIZATION N/A 09/13/2014   Procedure: Coronary Stent Intervention;  Surgeon: Burnell Blanks, MD;  Location: [place] CV LAB;  Service: Cardiovascular;  Laterality: N/A;  . CORONARY ARTERY BYPASS GRAFT  10/28/2013   CABG x4 using LIMA to LAD, SVG to ramus, SVG to OM, SVG to RCA, EVH via left thigh and leg - Dr. Veneta Penton    There were no vitals filed for this visit. Neuro and There Ex     Subjective Assessment - 09/10/16 0858    Subjective Patient has blood sugar  drop this morning with shaking. Took some juice to help   Pertinent History Pt. is a pleasant 66 year old female who presents to her physical therapy evaluation for diabetic neuropathy. Pt. had a triple bypass surgery approximately 10 years ago and stents placed 3 years ago.  Her pain in her low back and neurological symptoms down her legs (right in particular) began approximately around the time of the bypass surgery, just slightly prior according to the patient. She can only walk 1/5 of a mile (the distance to her mailbox) and requires frequent rest breaks due to fatigue. Pt. often falls asleep and states that naps are the only thing that helps with her pain and symptoms.    Limitations Walking;Reading;Lifting;Standing;Writing;House hold activities   How long can you sit comfortably? 5-10 minutes   How long can you stand comfortably? 1-2 minutes   How long can you walk comfortably? 1/5th mile   Patient Stated Goals want to be able to walk again, want to play with grandchildren   Currently in Pain? No/denies   Pain Onset --     5xSTS: 8 seconds Berg: 54/56 ABC=77.8% LEFS=34/80 Single limb stance: L 18, R >30 seconds   Manual Prone hip flexor stretch Grade I-II CPAs and UPAs, L3 hypomobile and painful on R and C,feel relief with L.  STM R piriformis  Manual-semi  reclined position Hamstring stretch hold 2x60 seconds with df overpressure PROM holds IR, ER, hamstring, df overpessure60 second holds Neural glides SLR Hip distractions SAD,LAD with belt 6x30 seconds Hip AP glides 2x30 second pulses  Seated hamstring stretch with band 2x60 seconds  Pt. response to medical necessity: Patient will benefit from skilled physical therapy services to improve mobility and decrease pain for return to previous functional level and increase quality of life       [facility] PT Assessment - 09/10/16 0001      Berg Balance Test   Sit to Stand Able to stand without using hands and stabilize  independently   Standing Unsupported Able to stand safely 2 minutes   Sitting with Back Unsupported but Feet Supported on Floor or Stool Able to sit safely and securely 2 minutes   Stand to Sit Sits safely with minimal use of hands   Transfers Able to transfer safely, minor use of hands   Standing Unsupported with Eyes Closed Able to stand 10 seconds safely   Standing Ubsupported with Feet Together Able to place feet together independently and stand 1 minute safely   From Standing, Reach Forward with Outstretched Arm Can reach confidently >25 cm (10")   From Standing Position, Pick up Object from Floor Able to pick up shoe safely and easily   From Standing Position, Turn to Look Behind Over each Shoulder Looks behind from both sides and weight shifts well   Turn 360 Degrees Able to turn 360 degrees safely one side only in 4 seconds or less   Standing Unsupported, Alternately Place Feet on Step/Stool Able to stand independently and complete 8 steps >20 seconds   Standing Unsupported, One Foot in Front Able to place foot tandem independently and hold 30 seconds   Standing on One Leg Able to lift leg independently and hold > 10 seconds   Total Score 54     Patient has progressed towards all goals at this time and is ready to progress towards independent home program. BERG=54/56 demonstrating improved functional balance and SLS >30 sec on R and 18 seconds on L. ABC=77.8%, LEFS=34/80 due to pt. Inability to run, 5x STS=8 seconds. Patient has improved functional balance and mobility at this time and is ready for discharge.                         PT Education - 09/10/16 0900    Education provided Yes   Education Details body mechanics when fatigued. resting with unstable glucose   Person(s) Educated Patient   Methods Explanation   Comprehension Verbalized understanding             PT Long Term Goals - 09/10/16 1104      PT LONG TERM GOAL #1   Title Patient (> 69  years old) will complete five times sit to stand test in < 15 seconds indicating an increased LE strength and improved balance.   Baseline 7/25: 8 sec; 6/18: 16   Time 6   Period Weeks   Status Achieved     PT LONG TERM GOAL #2   Title Patient will increase Berg Balance score to 54/56 to demonstrate decreased fall risk during functional activities.   Baseline 7/25: 54/56; 6/18: 50/56   Time 6   Period Weeks   Status Achieved     PT LONG TERM GOAL #3   Title Patient will increase ABC scale score >80% to demonstrate better functional mobility and  better confidence with ADLs.    Baseline 09/24/2022: 77.7%; 6/18: 67.5%   Time 6   Period Weeks   Status Partially Met     PT LONG TERM GOAL #4   Title Patient will increase lower extremity functional scale to >60/80 to demonstrate improved functional mobility and increased tolerance with ADLs   Baseline Sep 24, 2022: 34/80; 6/18: 31/80   Time 6   Period Weeks   Status Partially Met               Plan - 09/23/2016 1104    Clinical Impression Statement Patient has progressed towards all goals at this time and is ready to progress towards independent home program. BERG=54/56 demonstrating improved functional balance and SLS >30 sec on R and 18 seconds on L. ABC=77.8%, LEFS=34/80 due to pt. Inability to run, 5x STS=8 seconds. Patient has improved functional balance and mobility at this time and is ready for discharge.    Rehab Potential Fair   Clinical Impairments Affecting Rehab Potential uncontrolled diabetes, chronicity   PT Frequency 2x / week   PT Duration 6 weeks   PT Treatment/Interventions ADLs/Self Care Home Management;Moist Heat;Traction;Iontophoresis 87m/ml Dexamethasone;Electrical Stimulation;Cryotherapy;Ultrasound;DME Instruction;Gait training;Stair training;Balance training;Therapeutic exercise;Therapeutic activities;Functional mobility training;Neuromuscular re-education;Patient/family education;Manual techniques;Compression  bandaging;Passive range of motion;Dry needling;Taping;Splinting;Energy conservation;Visual/perceptual remediation/compensation   PT Home Exercise Plan see sheet   Consulted and Agree with Plan of Care Patient      Patient will benefit from skilled therapeutic intervention in order to improve the following deficits and impairments:  Abnormal gait, Cardiopulmonary status limiting activity, Decreased activity tolerance, Decreased balance, Decreased endurance, Decreased range of motion, Decreased mobility, Decreased strength, Difficulty walking, Hypomobility, Increased edema, Impaired flexibility, Impaired perceived functional ability, Postural dysfunction, Improper body mechanics, Pain  Visit Diagnosis: Unspecified abnormalities of gait and mobility  Chronic bilateral low back pain, with sciatica presence unspecified  Muscle weakness (generalized)       G-Codes - 02018/08/071105    Functional Assessment Tool Used (Outpatient Only) LEFS, ABC, 10MWT, 5xSTS, BERG,clinical judgement   Functional Limitation Mobility: Walking and moving around   Mobility: Walking and Moving Around Current Status [phone] At least 20 percent but less than 40 percent impaired, limited or restricted   Mobility: Walking and Moving Around Goal Status [phone] At least 20 percent but less than 40 percent impaired, limited or restricted   Mobility: Walking and Moving Around Discharge Status [phone] At least 20 percent but less than 40 percent impaired, limited or restricted      Problem List Patient Active Problem List   Diagnosis Date Noted  . Abnormal EKG 09/21/2014  . Dyslipidemia 008/07/16 . Elevated troponin 0August 07, 2016 . [place]stable angina ([place]   . DM2 (diabetes mellitus, type 2) ([place] 09/09/2014  . CAD -S/P SVG-Int and RCA DES 09/13/14 09/09/2014  . COPD (chronic obstructive pulmonary disease) ([place] 09/09/2014  . Benign essential HTN 09/09/2014  . GERD (gastroesophageal reflux disease) 09/09/2014  . S/P CABG  x 4- Sept 2015 10/28/2013   MJanna Arch PT, DPT   MJanna Arch708/08/2016 11:08 AM  [place] [address] [place] [address]Phone: [phone]  Fax:  [phone] Name: Debbie WENTZMRN: [id number] of Birth: [birth year]

## 2016-09-15 ENCOUNTER — Ambulatory Visit: Payer: Medicare Other

## 2016-09-17 ENCOUNTER — Ambulatory Visit: Payer: Medicare Other

## 2017-02-02 ENCOUNTER — Other Ambulatory Visit: Payer: Self-pay | Admitting: Student

## 2017-02-02 DIAGNOSIS — M545 Low back pain: Secondary | ICD-10-CM

## 2017-02-11 ENCOUNTER — Ambulatory Visit
Admission: RE | Admit: 2017-02-11 | Discharge: 2017-02-11 | Disposition: A | Discharge status: Home / Self Care | Payer: Medicare Other | Source: Ambulatory Visit | Attending: Student | Admitting: Student

## 2017-02-11 ENCOUNTER — Encounter (INDEPENDENT_AMBULATORY_CARE_PROVIDER_SITE_OTHER): Payer: Self-pay

## 2017-02-11 DIAGNOSIS — M48061 Spinal stenosis, lumbar region without neurogenic claudication: Secondary | ICD-10-CM | POA: Insufficient documentation

## 2017-02-11 DIAGNOSIS — M5127 Other intervertebral disc displacement, lumbosacral region: Secondary | ICD-10-CM | POA: Insufficient documentation

## 2017-02-11 DIAGNOSIS — M545 Low back pain: Secondary | ICD-10-CM

## 2017-02-11 DIAGNOSIS — M5126 Other intervertebral disc displacement, lumbar region: Secondary | ICD-10-CM | POA: Diagnosis not present

## 2017-02-11 DIAGNOSIS — M79605 Pain in left leg: Secondary | ICD-10-CM | POA: Insufficient documentation

## 2017-04-28 ENCOUNTER — Ambulatory Visit (INDEPENDENT_AMBULATORY_CARE_PROVIDER_SITE_OTHER): Payer: Medicare Other | Admitting: Podiatry

## 2017-04-28 DIAGNOSIS — L608 Other nail disorders: Secondary | ICD-10-CM

## 2017-04-28 NOTE — Progress Notes (Signed)
   HPI: 67 year old female with PMHx of T2DM presenting today as a new patient with a chief complaint of discoloration to the great toenail and fourth toenail of the left foot that appeared about three weeks ago. She has not done anything to treat the symptoms. There are no modifying factors noted. She denies pain. Patient is here for further evaluation and treatment.   Past Medical History:  Diagnosis Date  . CAD (coronary artery disease)   . COPD (chronic obstructive pulmonary disease) (HCC)   . Diabetes mellitus   . GERD (gastroesophageal reflux disease)   . Hypertension   . 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      Physical Exam: General: The patient is alert and oriented x3 in no acute distress.  Dermatology: brown discoloration noted to the left great toenail. Skin is warm, dry and supple bilateral lower extremities. Negative for open lesions or macerations.  Vascular: Palpable pedal pulses bilaterally. No edema or erythema noted. Capillary refill within normal limits.  Neurological: Epicritic and protective threshold grossly intact bilaterally.   Musculoskeletal Exam: Range of motion within normal limits to all pedal and ankle joints bilateral. Muscle strength 5/5 in all groups bilateral.    Assessment: - left great toenail brown discoloration   Plan of Care:  - Patient evaluated.  - Explained that the discoloration is likely due to some sore of nail irritation. Completely benign in appearance.  - Recommended good shoe gear.  - Return to clinic as needed.     Felecia ShellingBrent M. Evans, DPM [facility]  Dr. Felecia ShellingBrent M. Evans, DPM    [address]                                        [place], [place] [address]                Office [phone]  Fax [phone]

## 2018-06-28 ENCOUNTER — Ambulatory Visit (INDEPENDENT_AMBULATORY_CARE_PROVIDER_SITE_OTHER): Payer: Medicare Other | Admitting: Podiatry

## 2018-06-28 ENCOUNTER — Encounter: Payer: Self-pay | Admitting: Podiatry

## 2018-06-28 ENCOUNTER — Ambulatory Visit (INDEPENDENT_AMBULATORY_CARE_PROVIDER_SITE_OTHER): Payer: Medicare Other

## 2018-06-28 ENCOUNTER — Other Ambulatory Visit: Payer: Self-pay

## 2018-06-28 ENCOUNTER — Other Ambulatory Visit: Payer: Self-pay | Admitting: Podiatry

## 2018-06-28 VITALS — Temp 97.9°F

## 2018-06-28 DIAGNOSIS — M722 Plantar fascial fibromatosis: Secondary | ICD-10-CM | POA: Diagnosis not present

## 2018-06-28 DIAGNOSIS — M7662 Achilles tendinitis, left leg: Secondary | ICD-10-CM

## 2018-06-28 DIAGNOSIS — M7661 Achilles tendinitis, right leg: Secondary | ICD-10-CM

## 2018-06-28 DIAGNOSIS — M79672 Pain in left foot: Secondary | ICD-10-CM

## 2018-06-28 MED ORDER — TRIAMCINOLONE ACETONIDE 10 MG/ML IJ SUSP
10.0000 mg | Freq: Once | INTRAMUSCULAR | Status: AC
  Administered 2018-06-28: 10 mg

## 2018-06-28 NOTE — Progress Notes (Signed)
Subjective:   Patient ID: Debbie [name], female   DOB: 68 y.o.   MRN: [id number]   HPI Patient states the back of the heel has been hurting worse on the left than the right and it did really well for a period of time but has become painful   ROS      Objective:  Physical Exam  Neurovascular status intact with patient's posterior lateral heel left found to be very inflamed with the right being moderately inflamed not to the same degree with mild plantar discomfort also noted     Assessment:  Acute posterior Achilles tendinitis left lateral side along with moderate fasciitis symptoms bilateral and pain right     Plan:  H&P and discussed both conditions and today careful lateral injection administered 3 mg Dexasone Kenalog 5 mg Xylocaine after explaining chances for rupture associated with injection.  Also went ahead and dispensed night splint for the fasciitis and for the posterior pain along with instructions for ice therapy  X-rays indicate there is posterior spur formation bilateral with no indications of stress fracture or arthritis

## 2018-06-28 NOTE — Patient Instructions (Signed)
Plantar Fasciitis (Heel Spur Syndrome) with Rehab The plantar fascia is a fibrous, ligament-like, soft-tissue structure that spans the bottom of the foot. Plantar fasciitis is a condition that causes pain in the foot due to inflammation of the tissue. SYMPTOMS   Pain and tenderness on the underneath side of the foot.  Pain that worsens with standing or walking. CAUSES  Plantar fasciitis is caused by irritation and injury to the plantar fascia on the underneath side of the foot. Common mechanisms of injury include:  Direct trauma to bottom of the foot.  Damage to a small nerve that runs under the foot where the main fascia attaches to the heel bone.  Stress placed on the plantar fascia due to bone spurs. RISK INCREASES WITH:   Activities that place stress on the plantar fascia (running, jumping, pivoting, or cutting).  Poor strength and flexibility.  Improperly fitted shoes.  Tight calf muscles.  Flat feet.  Failure to warm-up properly before activity.  Obesity. PREVENTION  Warm up and stretch properly before activity.  Allow for adequate recovery between workouts.  Maintain physical fitness:  Strength, flexibility, and endurance.  Cardiovascular fitness.  Maintain a health body weight.  Avoid stress on the plantar fascia.  Wear properly fitted shoes, including arch supports for individuals who have flat feet.  PROGNOSIS  If treated properly, then the symptoms of plantar fasciitis usually resolve without surgery. However, occasionally surgery is necessary.  RELATED COMPLICATIONS   Recurrent symptoms that may result in a chronic condition.  Problems of the lower back that are caused by compensating for the injury, such as limping.  Pain or weakness of the foot during push-off following surgery.  Chronic inflammation, scarring, and partial or complete fascia tear, occurring more often from repeated injections.  TREATMENT  Treatment initially involves the  use of ice and medication to help reduce pain and inflammation. The use of strengthening and stretching exercises may help reduce pain with activity, especially stretches of the Achilles tendon. These exercises may be performed at home or with a therapist. Your caregiver may recommend that you use heel cups of arch supports to help reduce stress on the plantar fascia. Occasionally, corticosteroid injections are given to reduce inflammation. If symptoms persist for greater than 6 months despite non-surgical (conservative), then surgery may be recommended.   MEDICATION   If pain medication is necessary, then nonsteroidal anti-inflammatory medications, such as aspirin and ibuprofen, or other minor pain relievers, such as acetaminophen, are often recommended.  Do not take pain medication within 7 days before surgery.  Prescription pain relievers may be given if deemed necessary by your caregiver. Use only as directed and only as much as you need.  Corticosteroid injections may be given by your caregiver. These injections should be reserved for the most serious cases, because they may only be given a certain number of times.  HEAT AND COLD  Cold treatment (icing) relieves pain and reduces inflammation. Cold treatment should be applied for 10 to 15 minutes every 2 to 3 hours for inflammation and pain and immediately after any activity that aggravates your symptoms. Use ice packs or massage the area with a piece of ice (ice massage).  Heat treatment may be used prior to performing the stretching and strengthening activities prescribed by your caregiver, physical therapist, or athletic trainer. Use a heat pack or soak the injury in warm water.  SEEK IMMEDIATE MEDICAL CARE IF:  Treatment seems to offer no benefit, or the condition worsens.  Any medications   produce adverse side effects.  EXERCISES- RANGE OF MOTION (ROM) AND STRETCHING EXERCISES - Plantar Fasciitis (Heel Spur Syndrome) These exercises  may help you when beginning to rehabilitate your injury. Your symptoms may resolve with or without further involvement from your physician, physical therapist or athletic trainer. While completing these exercises, remember:   Restoring tissue flexibility helps normal motion to return to the joints. This allows healthier, less painful movement and activity.  An effective stretch should be held for at least 30 seconds.  A stretch should never be painful. You should only feel a gentle lengthening or release in the stretched tissue.  RANGE OF MOTION - Toe Extension, Flexion  Sit with your right / left leg crossed over your opposite knee.  Grasp your toes and gently pull them back toward the top of your foot. You should feel a stretch on the bottom of your toes and/or foot.  Hold this stretch for 10 seconds.  Now, gently pull your toes toward the bottom of your foot. You should feel a stretch on the top of your toes and or foot.  Hold this stretch for 10 seconds. Repeat  times. Complete this stretch 3 times per day.   RANGE OF MOTION - Ankle Dorsiflexion, Active Assisted  Remove shoes and sit on a chair that is preferably not on a carpeted surface.  Place right / left foot under knee. Extend your opposite leg for support.  Keeping your heel down, slide your right / left foot back toward the chair until you feel a stretch at your ankle or calf. If you do not feel a stretch, slide your bottom forward to the edge of the chair, while still keeping your heel down.  Hold this stretch for 10 seconds. Repeat 3 times. Complete this stretch 2 times per day.   STRETCH  Gastroc, Standing  Place hands on wall.  Extend right / left leg, keeping the front knee somewhat bent.  Slightly point your toes inward on your back foot.  Keeping your right / left heel on the floor and your knee straight, shift your weight toward the wall, not allowing your back to arch.  You should feel a gentle stretch  in the right / left calf. Hold this position for 10 seconds. Repeat 3 times. Complete this stretch 2 times per day.  STRETCH  Soleus, Standing  Place hands on wall.  Extend right / left leg, keeping the other knee somewhat bent.  Slightly point your toes inward on your back foot.  Keep your right / left heel on the floor, bend your back knee, and slightly shift your weight over the back leg so that you feel a gentle stretch deep in your back calf.  Hold this position for 10 seconds. Repeat 3 times. Complete this stretch 2 times per day.  STRETCH  Gastrocsoleus, Standing  Note: This exercise can place a lot of stress on your foot and ankle. Please complete this exercise only if specifically instructed by your caregiver.   Place the ball of your right / left foot on a step, keeping your other foot firmly on the same step.  Hold on to the wall or a rail for balance.  Slowly lift your other foot, allowing your body weight to press your heel down over the edge of the step.  You should feel a stretch in your right / left calf.  Hold this position for 10 seconds.  Repeat this exercise with a slight bend in your right /   left knee. Repeat 3 times. Complete this stretch 2 times per day.   STRENGTHENING EXERCISES - Plantar Fasciitis (Heel Spur Syndrome)  These exercises may help you when beginning to rehabilitate your injury. They may resolve your symptoms with or without further involvement from your physician, physical therapist or athletic trainer. While completing these exercises, remember:   Muscles can gain both the endurance and the strength needed for everyday activities through controlled exercises.  Complete these exercises as instructed by your physician, physical therapist or athletic trainer. Progress the resistance and repetitions only as guided.  STRENGTH - Towel Curls  Sit in a chair positioned on a non-carpeted surface.  Place your foot on a towel, keeping your heel  on the floor.  Pull the towel toward your heel by only curling your toes. Keep your heel on the floor. Repeat 3 times. Complete this exercise 2 times per day.  STRENGTH - Ankle Inversion  Secure one end of a rubber exercise band/tubing to a fixed object (table, pole). Loop the other end around your foot just before your toes.  Place your fists between your knees. This will focus your strengthening at your ankle.  Slowly, pull your big toe up and in, making sure the band/tubing is positioned to resist the entire motion.  Hold this position for 10 seconds.  Have your muscles resist the band/tubing as it slowly pulls your foot back to the starting position. Repeat 3 times. Complete this exercises 2 times per day.  Document Released: 02/03/2005 Document Revised: 04/28/2011 Document Reviewed: 05/18/2008 [employer] Patient Information 2014 [employer]. Achilles Tendinitis  with Rehab Achilles tendinitis is a disorder of the Achilles tendon. The Achilles tendon connects the large calf muscles (Gastrocnemius and Soleus) to the heel bone (calcaneus). This tendon is sometimes called the heel cord. It is important for pushing-off and standing on your toes and is important for walking, running, or jumping. Tendinitis is often caused by overuse and repetitive microtrauma. SYMPTOMS  Pain, tenderness, swelling, warmth, and redness may occur over the Achilles tendon even at rest.  Pain with pushing off, or flexing or extending the ankle.  Pain that is worsened after or during activity. CAUSES   Overuse sometimes seen with rapid increase in exercise programs or in sports requiring running and jumping.  Poor physical conditioning (strength and flexibility or endurance).  Running sports, especially training running down hills.  Inadequate warm-up before practice or play or failure to stretch before participation.  Injury to the tendon. PREVENTION   Warm up and stretch before practice or  competition.  Allow time for adequate rest and recovery between practices and competition.  Keep up conditioning.  Keep up ankle and leg flexibility.  Improve or keep muscle strength and endurance.  Improve cardiovascular fitness.  Use proper technique.  Use proper equipment (shoes, skates).  To help prevent recurrence, taping, protective strapping, or an adhesive bandage may be recommended for several weeks after healing is complete. PROGNOSIS   Recovery may take weeks to several months to heal.  Longer recovery is expected if symptoms have been prolonged.  Recovery is usually quicker if the inflammation is due to a direct blow as compared with overuse or sudden strain. RELATED COMPLICATIONS   Healing time will be prolonged if the condition is not correctly treated. The injury must be given plenty of time to heal.  Symptoms can reoccur if activity is resumed too soon.  Untreated, tendinitis may increase the risk of tendon rupture requiring additional time for recovery   and possibly surgery. TREATMENT   The first treatment consists of rest anti-inflammatory medication, and ice to relieve the pain.  Stretching and strengthening exercises after resolution of pain will likely help reduce the risk of recurrence. Referral to a physical therapist or athletic trainer for further evaluation and treatment may be helpful.  A walking boot or cast may be recommended to rest the Achilles tendon. This can help break the cycle of inflammation and microtrauma.  Arch supports (orthotics) may be prescribed or recommended by your caregiver as an adjunct to therapy and rest.  Surgery to remove the inflamed tendon lining or degenerated tendon tissue is rarely necessary and has shown less than predictable results. MEDICATION   Nonsteroidal anti-inflammatory medications, such as aspirin and ibuprofen, may be used for pain and inflammation relief. Do not take within 7 days before surgery. Take  these as directed by your caregiver. Contact your caregiver immediately if any bleeding, stomach upset, or signs of allergic reaction occur. Other minor pain relievers, such as acetaminophen, may also be used.  Pain relievers may be prescribed as necessary by your caregiver. Do not take prescription pain medication for longer than 4 to 7 days. Use only as directed and only as much as you need.  Cortisone injections are rarely indicated. Cortisone injections may weaken tendons and predispose to rupture. It is better to give the condition more time to heal than to use them. HEAT AND COLD  Cold is used to relieve pain and reduce inflammation for acute and chronic Achilles tendinitis. Cold should be applied for 10 to 15 minutes every 2 to 3 hours for inflammation and pain and immediately after any activity that aggravates your symptoms. Use ice packs or an ice massage.  Heat may be used before performing stretching and strengthening activities prescribed by your caregiver. Use a heat pack or a warm soak. SEEK MEDICAL CARE IF:  Symptoms get worse or do not improve in 2 weeks despite treatment.  New, unexplained symptoms develop. Drugs used in treatment may produce side effects.  EXERCISES:  RANGE OF MOTION (ROM) AND STRETCHING EXERCISES - Achilles Tendinitis  These exercises may help you when beginning to rehabilitate your injury. Your symptoms may resolve with or without further involvement from your physician, physical therapist or athletic trainer. While completing these exercises, remember:   Restoring tissue flexibility helps normal motion to return to the joints. This allows healthier, less painful movement and activity.  An effective stretch should be held for at least 30 seconds.  A stretch should never be painful. You should only feel a gentle lengthening or release in the stretched tissue.  STRETCH  Gastroc, Standing   Place hands on wall.  Extend right / left leg, keeping the  front knee somewhat bent.  Slightly point your toes inward on your back foot.  Keeping your right / left heel on the floor and your knee straight, shift your weight toward the wall, not allowing your back to arch.  You should feel a gentle stretch in the right / left calf. Hold this position for 10 seconds. Repeat 3 times. Complete this stretch 2 times per day.  STRETCH  Soleus, Standing   Place hands on wall.  Extend right / left leg, keeping the other knee somewhat bent.  Slightly point your toes inward on your back foot.  Keep your right / left heel on the floor, bend your back knee, and slightly shift your weight over the back leg so that you feel a   gentle stretch deep in your back calf.  Hold this position for 10 seconds. Repeat 3 times. Complete this stretch 2 times per day.  STRETCH  Gastrocsoleus, Standing  Note: This exercise can place a lot of stress on your foot and ankle. Please complete this exercise only if specifically instructed by your caregiver.   Place the ball of your right / left foot on a step, keeping your other foot firmly on the same step.  Hold on to the wall or a rail for balance.  Slowly lift your other foot, allowing your body weight to press your heel down over the edge of the step.  You should feel a stretch in your right / left calf.  Hold this position for 10 seconds.  Repeat this exercise with a slight bend in your knee. Repeat 3 times. Complete this stretch 2 times per day.   STRENGTHENING EXERCISES - Achilles Tendinitis These exercises may help you when beginning to rehabilitate your injury. They may resolve your symptoms with or without further involvement from your physician, physical therapist or athletic trainer. While completing these exercises, remember:   Muscles can gain both the endurance and the strength needed for everyday activities through controlled exercises.  Complete these exercises as instructed by your physician,  physical therapist or athletic trainer. Progress the resistance and repetitions only as guided.  You may experience muscle soreness or fatigue, but the pain or discomfort you are trying to eliminate should never worsen during these exercises. If this pain does worsen, stop and make certain you are following the directions exactly. If the pain is still present after adjustments, discontinue the exercise until you can discuss the trouble with your clinician.  STRENGTH - Plantar-flexors   Sit with your right / left leg extended. Holding onto both ends of a rubber exercise band/tubing, loop it around the ball of your foot. Keep a slight tension in the band.  Slowly push your toes away from you, pointing them downward.  Hold this position for 10 seconds. Return slowly, controlling the tension in the band/tubing. Repeat 3 times. Complete this exercise 2 times per day.   STRENGTH - Plantar-flexors   Stand with your feet shoulder width apart. Steady yourself with a wall or table using as little support as needed.  Keeping your weight evenly spread over the width of your feet, rise up on your toes.*  Hold this position for 10 seconds. Repeat 3 times. Complete this exercise 2 times per day.  *If this is too easy, shift your weight toward your right / left leg until you feel challenged. Ultimately, you may be asked to do this exercise with your right / left foot only.  STRENGTH  Plantar-flexors, Eccentric  Note: This exercise can place a lot of stress on your foot and ankle. Please complete this exercise only if specifically instructed by your caregiver.   Place the balls of your feet on a step. With your hands, use only enough support from a wall or rail to keep your balance.  Keep your knees straight and rise up on your toes.  Slowly shift your weight entirely to your right / left toes and pick up your opposite foot. Gently and with controlled movement, lower your weight through your right /  left foot so that your heel drops below the level of the step. You will feel a slight stretch in the back of your calf at the end position.  Use the healthy leg to help rise up onto   the balls of both feet, then lower weight only on the right / left leg again. Build up to 15 repetitions. Then progress to 3 consecutive sets of 15 repetitions.*  After completing the above exercise, complete the same exercise with a slight knee bend (about 30 degrees). Again, build up to 15 repetitions. Then progress to 3 consecutive sets of 15 repetitions.* Perform this exercise 2 times per day.  *When you easily complete 3 sets of 15, your physician, physical therapist or athletic trainer may advise you to add resistance by wearing a backpack filled with additional weight.  STRENGTH - Plantar Flexors, Seated   Sit on a chair that allows your feet to rest flat on the ground. If necessary, sit at the edge of the chair.  Keeping your toes firmly on the ground, lift your right / left heel as far as you can without increasing any discomfort in your ankle. Repeat 3 times. Complete this exercise 2 times a day.  

## 2018-07-09 ENCOUNTER — Ambulatory Visit (INDEPENDENT_AMBULATORY_CARE_PROVIDER_SITE_OTHER): Payer: Medicare Other | Admitting: Podiatry

## 2018-07-09 ENCOUNTER — Encounter: Payer: Self-pay | Admitting: Podiatry

## 2018-07-09 ENCOUNTER — Other Ambulatory Visit: Payer: Self-pay

## 2018-07-09 VITALS — Temp 97.9°F

## 2018-07-09 DIAGNOSIS — M7662 Achilles tendinitis, left leg: Secondary | ICD-10-CM

## 2018-07-09 DIAGNOSIS — M722 Plantar fascial fibromatosis: Secondary | ICD-10-CM | POA: Diagnosis not present

## 2018-07-09 MED ORDER — TRIAMCINOLONE ACETONIDE 10 MG/ML IJ SUSP: 10.0000 mg | Freq: Once | INTRAMUSCULAR | Status: AC

## 2018-07-09 NOTE — Progress Notes (Signed)
Subjective:   Patient ID: Debbie [name], female   DOB: 68 y.o.   MRN: [id number]   HPI Patient states the back of the heel is still painful but moderately improved in the plantar heel and arch is been quite sore.  Patient has been using the splint which is helpful but still having pain   ROS      Objective:  Physical Exam  Neurovascular status intact with patient's left heel being quite sore in the plantar fascia and the posterior heel that we worked on last time improved with pain still noted upon deep palpation or when she sits     Assessment:  Continued plantar fasciitis left with improved Achilles tendinitis     Plan:  H&P conditions reviewed operative focus on the plantar heel and I did inject the plantar fascia 3 mg Kenalog 5 mg Xylocaine and went ahead today and discussed continued stretching and ice therapy and splint for the posterior heel.  Reappoint if symptoms indicate

## 2018-09-16 ENCOUNTER — Ambulatory Visit (INDEPENDENT_AMBULATORY_CARE_PROVIDER_SITE_OTHER): Payer: Medicare Other | Admitting: Podiatry

## 2018-09-16 ENCOUNTER — Encounter: Payer: Self-pay | Admitting: Podiatry

## 2018-09-16 ENCOUNTER — Other Ambulatory Visit: Payer: Self-pay

## 2018-09-16 VITALS — Temp 98.4°F

## 2018-09-16 DIAGNOSIS — M722 Plantar fascial fibromatosis: Secondary | ICD-10-CM | POA: Diagnosis not present

## 2018-09-16 DIAGNOSIS — M7662 Achilles tendinitis, left leg: Secondary | ICD-10-CM

## 2018-09-16 NOTE — Progress Notes (Signed)
Subjective:   Patient ID: Debbie [name], female   DOB: 68 y.o.   MRN: [id number]   HPI Patient states seem to be doing fine but it is gotten worse again over the last month.  States it is really [name] this time and she feels like she did not get very much relief at last visit   ROS      Objective:  Physical Exam  Neurovascular status intact with acute discomfort plantar fascial left at the insertional point of the tendon into the calcaneus with inflammation fluid around the medial band     Assessment:  Acute plantar fasciitis left with inflammation fluid buildup     Plan:  H&P condition reviewed and today I reinjected the fascia 3 mg Kenalog 5 mg Xylocaine instructed on physical therapy and due to the intense discomfort I applied air fracture walker to try to take pressure off the foot.  Patient will be seen back to recheck

## 2018-11-17 ENCOUNTER — Ambulatory Visit: Payer: Medicare Other | Admitting: Podiatry

## 2019-09-09 ENCOUNTER — Other Ambulatory Visit: Payer: Self-pay

## 2019-09-09 ENCOUNTER — Emergency Department
Admission: EM | Admit: 2019-09-09 | Discharge: 2019-09-09 | Disposition: A | Discharge status: Home / Self Care | Payer: Medicare Other | Attending: Emergency Medicine | Admitting: Emergency Medicine

## 2019-09-09 ENCOUNTER — Emergency Department: Payer: Medicare Other

## 2019-09-09 ENCOUNTER — Encounter: Payer: Self-pay | Admitting: Emergency Medicine

## 2019-09-09 DIAGNOSIS — Z794 Long term (current) use of insulin: Secondary | ICD-10-CM | POA: Insufficient documentation

## 2019-09-09 DIAGNOSIS — I2510 Atherosclerotic heart disease of native coronary artery without angina pectoris: Secondary | ICD-10-CM | POA: Insufficient documentation

## 2019-09-09 DIAGNOSIS — R42 Dizziness and giddiness: Secondary | ICD-10-CM | POA: Insufficient documentation

## 2019-09-09 DIAGNOSIS — J449 Chronic obstructive pulmonary disease, unspecified: Secondary | ICD-10-CM | POA: Diagnosis not present

## 2019-09-09 DIAGNOSIS — Z951 Presence of aortocoronary bypass graft: Secondary | ICD-10-CM | POA: Insufficient documentation

## 2019-09-09 DIAGNOSIS — Z79899 Other long term (current) drug therapy: Secondary | ICD-10-CM | POA: Diagnosis not present

## 2019-09-09 DIAGNOSIS — Z91040 Latex allergy status: Secondary | ICD-10-CM | POA: Diagnosis not present

## 2019-09-09 DIAGNOSIS — E1140 Type 2 diabetes mellitus with diabetic neuropathy, unspecified: Secondary | ICD-10-CM | POA: Insufficient documentation

## 2019-09-09 DIAGNOSIS — Z87891 Personal history of nicotine dependence: Secondary | ICD-10-CM | POA: Diagnosis not present

## 2019-09-09 DIAGNOSIS — Z7982 Long term (current) use of aspirin: Secondary | ICD-10-CM | POA: Insufficient documentation

## 2019-09-09 DIAGNOSIS — I10 Essential (primary) hypertension: Secondary | ICD-10-CM | POA: Diagnosis not present

## 2019-09-09 LAB — CBC WITH DIFFERENTIAL/PLATELET
Abs Immature Granulocytes: 0.05 10*3/uL (ref 0.00–0.07)
Basophils Absolute: 0.1 10*3/uL (ref 0.0–0.1)
Basophils Relative: 1 %
Eosinophils Absolute: 0.3 10*3/uL (ref 0.0–0.5)
Eosinophils Relative: 4 %
HCT: 36.5 % (ref 36.0–46.0)
Hemoglobin: 12.1 g/dL (ref 12.0–15.0)
Immature Granulocytes: 1 %
Lymphocytes Relative: 22 %
Lymphs Abs: 2 10*3/uL (ref 0.7–4.0)
MCH: 26 pg (ref 26.0–34.0)
MCHC: 33.2 g/dL (ref 30.0–36.0)
MCV: 78.3 fL — ABNORMAL LOW (ref 80.0–100.0)
Monocytes Absolute: 1.1 10*3/uL — ABNORMAL HIGH (ref 0.1–1.0)
Monocytes Relative: 12 %
Neutro Abs: 5.6 10*3/uL (ref 1.7–7.7)
Neutrophils Relative %: 60 %
Platelets: 265 10*3/uL (ref 150–400)
RBC: 4.66 MIL/uL (ref 3.87–5.11)
RDW: 15.9 % — ABNORMAL HIGH (ref 11.5–15.5)
WBC: 9.1 10*3/uL (ref 4.0–10.5)
nRBC: 0 % (ref 0.0–0.2)

## 2019-09-09 LAB — COMPREHENSIVE METABOLIC PANEL
ALT: 25 U/L (ref 0–44)
AST: 25 U/L (ref 15–41)
Albumin: 3.8 g/dL (ref 3.5–5.0)
Alkaline Phosphatase: 80 U/L (ref 38–126)
Anion gap: 9 (ref 5–15)
BUN: 16 mg/dL (ref 8–23)
CO2: 27 mmol/L (ref 22–32)
Calcium: 9.1 mg/dL (ref 8.9–10.3)
Chloride: 103 mmol/L (ref 98–111)
Creatinine, Ser: 0.99 mg/dL (ref 0.44–1.00)
GFR calc Af Amer: >60 mL/min (ref >60)
GFR calc non Af Amer: 58 mL/min — ABNORMAL LOW (ref >60)
Glucose, Bld: 149 mg/dL — ABNORMAL HIGH (ref 70–99)
Potassium: 3.6 mmol/L (ref 3.5–5.1)
Sodium: 139 mmol/L (ref 135–145)
Total Bilirubin: 1 mg/dL (ref 0.3–1.2)
Total Protein: 7.3 g/dL (ref 6.5–8.1)

## 2019-09-09 LAB — URINALYSIS, COMPLETE (UACMP) WITH MICROSCOPIC
Bacteria, UA: NONE SEEN
Bilirubin Urine: NEGATIVE
Glucose, UA: NEGATIVE mg/dL
Hgb urine dipstick: NEGATIVE
Ketones, ur: NEGATIVE mg/dL
Leukocytes,Ua: NEGATIVE
Nitrite: NEGATIVE
Protein, ur: NEGATIVE mg/dL
Specific Gravity, Urine: 1.006 (ref 1.005–1.030)
pH: 7 (ref 5.0–8.0)

## 2019-09-09 LAB — TROPONIN I (HIGH SENSITIVITY): Troponin I (High Sensitivity): 11 ng/L (ref <18)

## 2019-09-09 MED ORDER — MECLIZINE HCL 25 MG PO TABS
25.0000 mg | ORAL_TABLET | Freq: Once | ORAL | Status: AC
  Administered 2019-09-09: 25 mg via ORAL
  Filled 2019-09-09: qty 1

## 2019-09-09 MED ORDER — MECLIZINE HCL 25 MG PO TABS
25.0000 mg | ORAL_TABLET | Freq: Three times a day (TID) | ORAL | 0 refills | Status: AC | PRN
Start: 2019-09-09 — End: ?

## 2019-09-09 MED ORDER — LORAZEPAM 2 MG/ML IJ SOLN
1.0000 mg | Freq: Once | INTRAMUSCULAR | Status: AC
  Administered 2019-09-09: 1 mg via INTRAVENOUS
  Filled 2019-09-09: qty 1

## 2019-09-09 NOTE — ED Notes (Signed)
Patient assisted to toilet at this time

## 2019-09-09 NOTE — ED Notes (Signed)
Verbal order for Ativan to be given pre-MRI due to claustrophobia

## 2019-09-09 NOTE — ED Notes (Signed)
ED Provider at bedside updating on results

## 2019-09-09 NOTE — ED Triage Notes (Signed)
Pt to ED via [facility] from home for dizzyness and nausea. Pt reports vertigo like sx. Denies SHOB/ CP/ or vomiting. 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 Pt A&Ox4. Pt had 1 occurrence of dry heaving and was given 4mg  of zofran IV. Pt reports the room spinning and denies any pain at this time.

## 2019-09-09 NOTE — ED Provider Notes (Signed)
[facility] Emergency Department Provider Note  ____________________________________________  Time seen: Approximately 6:50 AM  I have reviewed the triage vital signs and the nursing notes.   HISTORY  Chief Complaint Dizziness (vertigo) and Nausea   HPI Debbie [name] is a 69 y.o. female with a history of CAD, COPD, diabetes, GERD, hypertension, CABG who presents for evaluation of dizziness.  Patient reports being in her usual state of health when she went to bed last night.  She woke up this morning with dizziness.  She describes as if the room was moving and unsteady.  She denies feeling lightheaded.  Associated symptoms include nausea and several episodes of nonbloody nonbilious emesis which has now resolved after 4 mg of zofran per EMS.  No headache, no syncope, no chest pain, no shortness of breath, no fever, no nausea or vomiting, no diarrhea, no dysuria.  Patient denies ever having vertigo in the past.  No history of stroke.  No slurred speech, no facial droop, no unilateral weakness or numbness.   Past Medical History:  Diagnosis Date  . CAD (coronary artery disease)   . COPD (chronic obstructive pulmonary disease) (HCC)   . Diabetes mellitus   . GERD (gastroesophageal reflux disease)   . Hypertension   . 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 Linna Darner at [facility]     Patient Active Problem List   Diagnosis Date Noted  . B12 deficiency 07/23/2016  . Diabetic neuropathy (HCC) 07/23/2016  . Sleep disorder 07/22/2016  . Abnormal EKG 09/21/2014  . Dyslipidemia 09/11/2014  . Elevated troponin 09/11/2014  . 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 Unstable angina (HCC)   . DM2 (diabetes mellitus, type 2) (HCC) 09/09/2014  . CAD -S/P SVG-Int and RCA DES 09/13/14 09/09/2014  . COPD (chronic obstructive pulmonary disease) (HCC) 09/09/2014  .  Benign essential HTN 09/09/2014  . GERD (gastroesophageal reflux disease) 09/09/2014  . CAD (coronary artery disease), native coronary artery 09/09/2014  . S/P CABG x 4- Sept 2015 10/28/2013    Past Surgical History:  Procedure Laterality Date  . ABDOMINAL HYSTERECTOMY    . CARDIAC CATHETERIZATION N/A 09/12/2014   Procedure: Left Heart Cath and Coronary Angiography;  Surgeon: Lyn Records, MD;  Location: [facility] INVASIVE CV LAB;  Service: Cardiovascular;  Laterality: N/A;  . CARDIAC CATHETERIZATION N/A 09/13/2014   Procedure: Coronary Stent Intervention;  Surgeon: Kathleene Hazel, MD;  Location: [facility] INVASIVE CV LAB;  Service: Cardiovascular;  Laterality: N/A;  . CORONARY ARTERY BYPASS GRAFT  10/28/2013   CABG x4 using LIMA to LAD, SVG to ramus, SVG to OM, SVG to RCA, EVH via left thigh and leg - Dr. Linna Darner    Prior to Admission medications   Medication Sig Start Date End Date Taking? Authorizing Provider  amLODipine (NORVASC) 5 MG tablet Take 5 mg by mouth daily.    [provider]  aspirin EC 81 MG tablet Take 1 tablet (81 mg total) by mouth daily. 09/21/14   Abelino Derrick, PA-C  cholecalciferol (VITAMIN D) 1000 UNITS tablet Take 2,000 Units by mouth daily.    [provider]  CINNAMON PO Take 2,000 mg by mouth daily.     [provider]  clopidogrel (PLAVIX) 75 MG tablet TAKE 1 TABLET (75 MG TOTAL) BY MOUTH DAILY WITH BREAKFAST. 03/09/15   Antoine Poche,  MD  gabapentin (NEURONTIN) 300 MG capsule  04/17/17   [provider]  hydrochlorothiazide (HYDRODIURIL) 25 MG tablet  06/15/18   [provider]  insulin aspart (NOVOLOG) 100 UNIT/ML FlexPen Inject 3 Units into the skin 3 (three) times daily with meals. 09/14/14   Marinda ElkFeliz Ortiz, Abraham, MD  lisinopril (PRINIVIL,ZESTRIL) 20 MG tablet Take 20 mg by mouth daily.    [provider]  Multiple Vitamin (MULTIVITAMIN WITH MINERALS) TABS tablet Take 2 tablets by mouth daily.    [provider]  niacin 250 MG tablet Take 250 mg by mouth at bedtime.    [provider]  pantoprazole (PROTONIX) 40 MG tablet TAKE 1 TABLET (40 MG TOTAL) BY MOUTH DAILY. 06/29/15   Abelino DerrickKilroy, Luke K, PA-C  PSYLLIUM HUSK PO Take 2 tablets by mouth daily.    [provider]    Allergies Canagliflozin, Hydrocodone, Metoprolol, Percodan [oxycodone-aspirin], Propofol, Statins, Latex, and Tape  History reviewed. No pertinent family history.  Social History Social History   Tobacco Use  . Smoking status: Former Smoker    Quit date: 08/29/1988    Years since quitting: 31.0  . Smokeless tobacco: Never Used  Substance Use Topics  . Alcohol use: No    Alcohol/week: 0.0 standard drinks  . Drug use: No    Review of Systems  Constitutional: Negative for fever. Eyes: Negative for visual changes. ENT: Negative for sore throat. Neck: No neck pain  Cardiovascular: Negative for chest pain. Respiratory: Negative for shortness of breath. Gastrointestinal: Negative for abdominal pain or diarrhea. + N/V Genitourinary: Negative for dysuria. Musculoskeletal: Negative for back pain. Skin: Negative for rash. Neurological: Negative for headaches, weakness or numbness. + vertigo Psych: No SI or HI  ____________________________________________   PHYSICAL EXAM:  VITAL SIGNS: ED Triage Vitals  Enc Vitals Group     BP 09/09/19 0604 (!) 183/50     Pulse Rate 09/09/19 0604 77     Resp 09/09/19 0604 16     Temp 09/09/19 0604 98.1 F (36.7 C)     Temp Source 09/09/19 0604 Oral     SpO2 09/09/19 0604 99 %     Weight 09/09/19 0605 183 lb (83 kg)     Height 09/09/19 0605 5' (1.524 m)     Head Circumference --      Peak Flow --      Pain Score 09/09/19 0605 0     Pain Loc --      Pain Edu? --      Excl. in GC? --     Constitutional: Alert and oriented.  HEENT:      Head: Normocephalic and atraumatic.         Eyes: Conjunctivae are normal. Sclera is non-icteric.        Mouth/Throat: Mucous membranes are moist.       Neck: Supple with no signs of meningismus. Cardiovascular: Regular rate and rhythm. No murmurs, gallops, or rubs. Respiratory: Normal respiratory effort. Lungs are clear to auscultation bilaterally. Gastrointestinal: Soft, non tender. Musculoskeletal:  No edema, cyanosis, or erythema of extremities. Neurologic: Normal speech and language. Face is symmetric. EOMI, PERRL, no nystagmus, ataxic gait, intact strength and sensation x4, no pronator drift, no dysmetria. Skin: Skin is warm, dry and intact. No rash noted. Psychiatric: Mood and affect are normal. Speech and behavior are normal.  ____________________________________________   LABS (all labs ordered are listed, but only abnormal results are displayed)  Labs Reviewed  CBC WITH DIFFERENTIAL/PLATELET - Abnormal;  Notable for the following components:      Result Value   MCV 78.3 (*)    RDW 15.9 (*)    Monocytes Absolute 1.1 (*)    All other components within normal limits  COMPREHENSIVE METABOLIC PANEL - Abnormal; Notable for the following components:   Glucose, Bld 149 (*)    GFR calc non Af Amer 58 (*)    All other components within normal limits  URINALYSIS, COMPLETE (UACMP) WITH MICROSCOPIC - Abnormal; Notable for the following components:   Color, Urine YELLOW (*)    APPearance HAZY (*)    All other components within normal limits  CBG MONITORING, ED  TROPONIN I (HIGH SENSITIVITY)   ____________________________________________  EKG  ED ECG REPORT I, Nita Sickle, the attending physician, personally viewed and interpreted this ECG.  Normal sinus rhythm, rate of 79, normal intervals, normal axis, no ST elevations or depressions. ____________________________________________  RADIOLOGY  I have personally reviewed the images performed during this visit and I agree with the Radiologist's read.   Interpretation by Radiologist:  CT Head Wo Contrast  Result Date:  09/09/2019 CLINICAL DATA:  Dizziness and nausea. EXAM: CT HEAD WITHOUT CONTRAST TECHNIQUE: Contiguous axial images were obtained from the base of the skull through the vertex without intravenous contrast. COMPARISON:  01/09/2013 FINDINGS: Brain: There is no evidence of acute infarct, intracranial hemorrhage, mass, midline shift, or extra-axial fluid collection. The ventricles and sulci are normal. Vascular: Calcified atherosclerosis at the skull base. No hyperdense vessel. Skull: No fracture or suspicious osseous lesion. Sinuses/Orbits: Visualized paranasal sinuses and mastoid air cells are clear. Visualized orbits are unremarkable. Other: None. IMPRESSION: Unremarkable CT appearance of the brain. Electronically Signed   By: Sebastian Ache M.D.   On: 09/09/2019 06:52     ____________________________________________   PROCEDURES  Procedure(s) performed:yes .1-3 Lead EKG Interpretation Performed by: Nita Sickle, MD Authorized by: Nita Sickle, MD     Interpretation: normal     ECG rate assessment: normal     Rhythm: sinus rhythm     Ectopy: none     Critical Care performed:  None ____________________________________________   INITIAL IMPRESSION / ASSESSMENT AND PLAN / ED COURSE  69 y.o. female with a history of CAD on Plavix, COPD, diabetes, GERD, hypertension, CABG who presents for evaluation of dizziness.  Patient arrives with sudden onset of vertigo, with ataxic gait but otherwise neurologically intact.  EKG with no signs of dysrhythmias.  Labs with no signs of anemia or significant electrolyte derangements, troponin is negative showing no signs of cardiac ischemia.  UA negative for UTI or any significant dehydration.  Head CT negative for bleed or any acute abnormalities, confirmed by radiology.  Patient was given meclizine but continues to have persistent vertigo.  Will send for an MRI to rule out a posterior fossa stroke.  Care transferred to Dr. Lenard Lance.       _____________________________________________ Please note:  Patient was evaluated in Emergency Department today for the symptoms described in the history of present illness. Patient was evaluated in the context of the global COVID-19 pandemic, which necessitated consideration that the patient might be at risk for infection with the SARS-CoV-2 virus that causes COVID-19. Institutional protocols and algorithms that pertain to the evaluation of patients at risk for COVID-19 are in a state of rapid change based on information released by regulatory bodies including the CDC and federal and state organizations. These policies and algorithms were followed during the patient's care in the ED.  Some ED  evaluations and interventions may be delayed as a result of limited staffing during the pandemic.   [place] Controlled Substance Database was reviewed by me. ____________________________________________   FINAL CLINICAL IMPRESSION(S) / ED DIAGNOSES   Final diagnoses:  Vertigo      NEW MEDICATIONS STARTED DURING THIS VISIT:  ED Discharge Orders    None       Note:  This document was prepared using Dragon voice recognition software and may include unintentional dictation errors.    Don Perking, [place], MD 09/09/19 [phone]

## 2019-09-09 NOTE — ED Notes (Signed)
Pt taken to CT at this time.

## 2019-09-09 NOTE — ED Notes (Signed)
Patient transported to MRI at this time. 

## 2019-09-09 NOTE — ED Provider Notes (Signed)
-----------------------------------------   8:13 AM on 09/09/2019 -----------------------------------------  Patient's work-up is reassuring including a negative MRI.  Patient is very tired after receiving her medication.  We will continue to monitor in the emergency department.  As long as the patient continues to improve I would anticipate likely discharge home once she is more awake and we are able to ambulate to ensure she is appropriate for discharge home.  Highly suspect BPPV to be the cause of the patient's symptoms.  ----------------------------------------- 10:06 AM on 09/09/2019 -----------------------------------------  Patient has been up and has ambulated to the bathroom.  We will discharge with meclizine and PCP follow-up.   Minna Antis, MD 09/09/19 1006

## 2020-12-03 ENCOUNTER — Encounter: Payer: Self-pay | Admitting: Podiatry

## 2020-12-03 ENCOUNTER — Other Ambulatory Visit: Payer: Self-pay

## 2020-12-03 ENCOUNTER — Ambulatory Visit (INDEPENDENT_AMBULATORY_CARE_PROVIDER_SITE_OTHER): Payer: Medicare HMO | Admitting: Podiatry

## 2020-12-03 ENCOUNTER — Ambulatory Visit (INDEPENDENT_AMBULATORY_CARE_PROVIDER_SITE_OTHER): Payer: Medicare HMO

## 2020-12-03 VITALS — BP 149/60 | HR 62 | Temp 99.2°F | Resp 16

## 2020-12-03 DIAGNOSIS — S90122A Contusion of left lesser toe(s) without damage to nail, initial encounter: Secondary | ICD-10-CM

## 2020-12-03 DIAGNOSIS — M7989 Other specified soft tissue disorders: Secondary | ICD-10-CM | POA: Diagnosis not present

## 2020-12-03 DIAGNOSIS — L603 Nail dystrophy: Secondary | ICD-10-CM

## 2020-12-03 DIAGNOSIS — M79675 Pain in left toe(s): Secondary | ICD-10-CM

## 2020-12-03 NOTE — Addendum Note (Signed)
Addended by: Enedina Finner on: 12/03/2020 06:08 PM   Modules accepted: Orders

## 2020-12-03 NOTE — Progress Notes (Signed)
  Subjective:  Patient ID: Debbie [name], female    DOB: [birth year],  MRN: [id number]  Chief Complaint  Patient presents with   Nail Problem    "This second toe on the left foot is killing me."    70 y.o. female presents with the above complaint. History confirmed with patient.  No new injury that she knows of.  She also has discoloration of the toenails which is an ongoing problem.  She has persistent issues with her toenails constantly  Objective:  Physical Exam: warm, good capillary refill, no trophic changes or ulcerative lesions, normal DP and PT pulses, and normal sensory exam. Left Foot: Second toe swollen red and painful, nail is well attached no signs of paronychia Right Foot: Discoloration of the hallux nail with yellow discoloration  No images are attached to the encounter. Radiographs:Multiple views x-ray of the left foot: Questionable lucency at the distal phalanx tuft on the lateral view Assessment:   1. Contusion of second toe of left foot, initial encounter   2. Pain and swelling of toe of left foot   3. Nail dystrophy      Plan:  Patient was evaluated and treated and all questions answered.  I evaluated her and her second toe and is unclear to me if this is a small nondisplaced fracture of the tuft of the phalanx.  It does not appear to be directly related to the toenail such as a paronychia or ingrown toenail.  I also recommended we evaluate for possibility of occult infection or arthritic changes such as gout and lab work was written for all of this.  I will send her medications discussed with her over the phone pending results  Regarding the right hallux nail discoloration I took a nail culture of this and will send to the lab for analysis.  Unclear if it is superficial white onychomycosis.  We discussed topical medication.  We will treat pending results  Return in about 3 weeks (around 12/24/2020) for re-check left 2nd toe, lab work .

## 2020-12-05 ENCOUNTER — Telehealth: Payer: Self-pay | Admitting: Podiatry

## 2020-12-05 LAB — CBC WITH DIFFERENTIAL/PLATELET
Basophils Absolute: 0.1 10*3/uL (ref 0.0–0.2)
Basos: 1 %
EOS (ABSOLUTE): 0.4 10*3/uL (ref 0.0–0.4)
Eos: 5 %
Hematocrit: 36.8 % (ref 34.0–46.6)
Hemoglobin: 11.9 g/dL (ref 11.1–15.9)
Immature Grans (Abs): 0 10*3/uL (ref 0.0–0.1)
Immature Granulocytes: 0 %
Lymphocytes Absolute: 2.2 10*3/uL (ref 0.7–3.1)
Lymphs: 24 %
MCH: 25.3 pg — ABNORMAL LOW (ref 26.6–33.0)
MCHC: 32.3 g/dL (ref 31.5–35.7)
MCV: 78 fL — ABNORMAL LOW (ref 79–97)
Monocytes Absolute: 0.9 10*3/uL (ref 0.1–0.9)
Monocytes: 10 %
Neutrophils Absolute: 5.5 10*3/uL (ref 1.4–7.0)
Neutrophils: 60 %
Platelets: 302 10*3/uL (ref 150–450)
RBC: 4.7 x10E6/uL (ref 3.77–5.28)
RDW: 14.3 % (ref 11.7–15.4)
WBC: 9.1 10*3/uL (ref 3.4–10.8)

## 2020-12-05 LAB — ARTHRITIS PANEL
Anti Nuclear Antibody (ANA): NEGATIVE
Rheumatoid fact SerPl-aCnc: <10 IU/mL (ref <14.0)
Sed Rate: 23 mm/hr (ref 0–40)
Uric Acid: 5.2 mg/dL (ref 3.0–7.2)

## 2020-12-05 NOTE — Telephone Encounter (Signed)
I spoke with the patient by phone and reviewed her lab work with her.  Discussed that does not appear to be definitive gout or cellulitis either way.  She said its actually doing a lot better and started hurting less and less last night.  Still little bit sore.  I discussed with her if it goes away on its own that she can cancel her follow-up appointment with me, if it worsens or does not go away would try empiric antibiotics in case there is abscess of the toenail that was not readily visible.  Also could be gout that did not show on the uric acid examination depending on timing.  She will let me know how she is doing.

## 2020-12-26 ENCOUNTER — Ambulatory Visit: Payer: Medicare HMO | Admitting: Podiatry
